# Patient Record
Sex: Male | Born: 1970 | Race: White | Hispanic: No | Marital: Married | Smoking: Former smoker
Health system: Southern US, Community
[De-identification: ages and names within clinical notes are randomized; demographics above are authoritative.]

## PROBLEM LIST (undated history)

## (undated) DIAGNOSIS — S82899A Other fracture of unspecified lower leg, initial encounter for closed fracture: Secondary | ICD-10-CM

## (undated) DIAGNOSIS — K859 Acute pancreatitis without necrosis or infection, unspecified: Secondary | ICD-10-CM

## (undated) HISTORY — DX: Other fracture of unspecified lower leg, initial encounter for closed fracture: S82.899A

## (undated) HISTORY — PX: OTHER SURGICAL HISTORY: SHX169

---

## 2006-07-25 ENCOUNTER — Ambulatory Visit (HOSPITAL_COMMUNITY): Admission: RE | Admit: 2006-07-25 | Discharge: 2006-07-25 | Payer: Self-pay | Admitting: Family Medicine

## 2009-11-04 ENCOUNTER — Emergency Department (HOSPITAL_COMMUNITY): Admission: EM | Admit: 2009-11-04 | Discharge: 2009-11-04 | Payer: Self-pay | Admitting: Emergency Medicine

## 2010-08-02 LAB — CBC
HCT: 45.6 % (ref 39.0–52.0)
Hemoglobin: 15.7 g/dL (ref 13.0–17.0)
MCHC: 34.4 g/dL (ref 30.0–36.0)
MCV: 87.9 fL (ref 78.0–100.0)
Platelets: 127 10*3/uL — ABNORMAL LOW (ref 150–400)
RDW: 13.4 % (ref 11.5–15.5)
WBC: 8 10*3/uL (ref 4.0–10.5)

## 2010-08-02 LAB — DIFFERENTIAL
Basophils Absolute: 0 10*3/uL (ref 0.0–0.1)
Eosinophils Relative: 4 % (ref 0–5)
Monocytes Absolute: 0.5 10*3/uL (ref 0.1–1.0)
Neutro Abs: 5.4 10*3/uL (ref 1.7–7.7)
Neutrophils Relative %: 68 % (ref 43–77)

## 2010-08-02 LAB — BASIC METABOLIC PANEL
BUN: 14 mg/dL (ref 6–23)
CO2: 24 mEq/L (ref 19–32)
Calcium: 9.1 mg/dL (ref 8.4–10.5)
Chloride: 105 mEq/L (ref 96–112)
Creatinine, Ser: 0.84 mg/dL (ref 0.4–1.5)
GFR calc Af Amer: >60 mL/min (ref >60)
GFR calc non Af Amer: >60 mL/min (ref >60)
Potassium: 3.4 mEq/L — ABNORMAL LOW (ref 3.5–5.1)
Sodium: 138 mEq/L (ref 135–145)

## 2010-08-02 LAB — CK TOTAL AND CKMB (NOT AT ARMC): Total CK: 101 U/L (ref 7–232)

## 2010-08-02 LAB — POCT CARDIAC MARKERS
Myoglobin, poc: 41.2 ng/mL (ref 12–200)
Troponin i, poc: <0.05 ng/mL (ref 0.00–0.09)

## 2010-08-02 LAB — TROPONIN I: Troponin I: 0.01 ng/mL (ref 0.00–0.06)

## 2012-02-06 ENCOUNTER — Emergency Department (HOSPITAL_COMMUNITY): Payer: 59

## 2012-02-06 ENCOUNTER — Inpatient Hospital Stay (HOSPITAL_COMMUNITY)
Admission: EM | Admit: 2012-02-06 | Discharge: 2012-02-09 | DRG: 439 | Disposition: A | Discharge status: Home / Self Care | Payer: 59 | Attending: Internal Medicine | Admitting: Internal Medicine

## 2012-02-06 ENCOUNTER — Encounter (HOSPITAL_COMMUNITY): Payer: Self-pay | Admitting: *Deleted

## 2012-02-06 DIAGNOSIS — E860 Dehydration: Secondary | ICD-10-CM | POA: Diagnosis present

## 2012-02-06 DIAGNOSIS — E871 Hypo-osmolality and hyponatremia: Secondary | ICD-10-CM | POA: Diagnosis present

## 2012-02-06 DIAGNOSIS — R7309 Other abnormal glucose: Secondary | ICD-10-CM | POA: Diagnosis present

## 2012-02-06 DIAGNOSIS — K859 Acute pancreatitis without necrosis or infection, unspecified: Principal | ICD-10-CM | POA: Diagnosis present

## 2012-02-06 LAB — COMPREHENSIVE METABOLIC PANEL
ALT: 25 U/L (ref 0–53)
AST: 22 U/L (ref 0–37)
Albumin: 4 g/dL (ref 3.5–5.2)
Alkaline Phosphatase: 70 U/L (ref 39–117)
BUN: 9 mg/dL (ref 6–23)
CO2: 26 mEq/L (ref 19–32)
Calcium: 9.4 mg/dL (ref 8.4–10.5)
Glucose, Bld: 113 mg/dL — ABNORMAL HIGH (ref 70–99)
Sodium: 132 mEq/L — ABNORMAL LOW (ref 135–145)
Total Bilirubin: 0.6 mg/dL (ref 0.3–1.2)
Total Protein: 7.8 g/dL (ref 6.0–8.3)

## 2012-02-06 LAB — URINALYSIS, ROUTINE W REFLEX MICROSCOPIC
Bilirubin Urine: NEGATIVE
Glucose, UA: NEGATIVE mg/dL
Hgb urine dipstick: NEGATIVE
Protein, ur: NEGATIVE mg/dL
Urobilinogen, UA: 0.2 mg/dL (ref 0.0–1.0)
pH: 7 (ref 5.0–8.0)

## 2012-02-06 LAB — CBC WITH DIFFERENTIAL/PLATELET
MCH: 30.1 pg (ref 26.0–34.0)
Platelets: 128 10*3/uL — ABNORMAL LOW (ref 150–400)
RBC: 5.15 MIL/uL (ref 4.22–5.81)

## 2012-02-06 LAB — LIPASE, BLOOD: Lipase: 70 U/L — ABNORMAL HIGH (ref 11–59)

## 2012-02-06 MED ORDER — ONDANSETRON HCL 4 MG/2ML IJ SOLN
4.0000 mg | Freq: Once | INTRAMUSCULAR | Status: AC
  Administered 2012-02-06: 4 mg via INTRAVENOUS
  Filled 2012-02-06: qty 2

## 2012-02-06 MED ORDER — HYDROMORPHONE HCL PF 1 MG/ML IJ SOLN
INTRAMUSCULAR | Status: AC
  Administered 2012-02-06: 1 mg
  Filled 2012-02-06: qty 1

## 2012-02-06 MED ORDER — ONDANSETRON HCL 4 MG/2ML IJ SOLN
INTRAMUSCULAR | Status: AC
  Administered 2012-02-06: 4 mg
  Filled 2012-02-06: qty 2

## 2012-02-06 MED ORDER — IOHEXOL 300 MG/ML  SOLN
100.0000 mL | Freq: Once | INTRAMUSCULAR | Status: AC | PRN
  Administered 2012-02-06: 100 mL via INTRAVENOUS

## 2012-02-06 MED ORDER — HYDROMORPHONE HCL PF 1 MG/ML IJ SOLN
1.0000 mg | Freq: Once | INTRAMUSCULAR | Status: AC
  Administered 2012-02-06: 1 mg via INTRAVENOUS
  Filled 2012-02-06: qty 1

## 2012-02-06 MED ORDER — SODIUM CHLORIDE 0.9 % IV BOLUS (SEPSIS)
1000.0000 mL | Freq: Once | INTRAVENOUS | Status: AC
  Administered 2012-02-06: 1000 mL via INTRAVENOUS

## 2012-02-06 MED ORDER — SODIUM CHLORIDE 0.9 % IV SOLN
INTRAVENOUS | Status: DC
  Administered 2012-02-07 – 2012-02-08 (×4): via INTRAVENOUS
  Filled 2012-02-06 (×11): qty 1000

## 2012-02-06 NOTE — ED Notes (Signed)
Pt c/o abdominal pain (fullness, burning) and fever x 1-2 days.

## 2012-02-06 NOTE — ED Provider Notes (Signed)
History  This chart was scribed for Donnetta Hutching, MD by Erskine Emery. This patient was seen in room APA03/APA03 and the patient's care was started at 19:20.   CSN: [id number]  Arrival date & time 02/06/12  1911   First MD Initiated Contact with Patient 02/06/12 1920      Chief Complaint  Patient presents with  . Abdominal Pain  . Fever    (Consider location/radiation/quality/duration/timing/severity/associated sxs/prior treatment) The history is provided by the patient. No language interpreter was used.  James Underwood is a 41 y.o. male who presents to the Emergency Department complaining of a constant burning periumbilical abdominal pain of an 8/10 severity since Friday (2 days ago). Pt reports associated appetite supression, insomnia, possible fever, and intermittent nausea upon trying to eat but denies any emesis, diarrhea, flank pain, dysuria, frequency, or aggravated pain upon pushing on the area. Pt has been hydrating and taking ibuprofen and percocet (last dose 2 hours ago) with only temporary mild relief from pain. Pt has had heart burn before and claims this pain is nothing like that and much lower in location. Pt has no h/o abdominal surgery. Pt has no other medical conditions and is on no medications.  Dr. Lubertha South is the pt's PCP.  History reviewed. No pertinent past medical history.  History reviewed. No pertinent past surgical history.  History reviewed. No pertinent family history.  History  Substance Use Topics  . Smoking status: Never Smoker   . Smokeless tobacco: Not on file  . Alcohol Use: No      Review of Systems A complete 10 system review of systems was obtained and all systems are negative except as noted in the HPI and PMH.    Allergies  Review of patient's allergies indicates no known allergies.  Home Medications  No current outpatient prescriptions on file.  Triage Vitals: BP 152/97  Pulse 90  Temp 98.6 F (37 C)  Resp 18  Ht 5\' 11"   (1.803 m)  Wt 200 lb (90.719 kg)  BMI 27.89 kg/m2  SpO2 100%  Physical Exam  Nursing note and vitals reviewed. Constitutional: He is oriented to person, place, and time. He appears well-developed and well-nourished.  HENT:  Head: Normocephalic and atraumatic.  Eyes: Conjunctivae normal and EOM are normal. Pupils are equal, round, and reactive to light.  Neck: Normal range of motion. Neck supple.  Cardiovascular: Normal rate, regular rhythm and normal heart sounds.   Pulmonary/Chest: Effort normal and breath sounds normal.  Abdominal: Soft. Bowel sounds are normal. There is no tenderness.       Pt is nontender upon palpation.  Musculoskeletal: Normal range of motion.  Neurological: He is alert and oriented to person, place, and time.  Skin: Skin is warm and dry.  Psychiatric: He has a normal mood and affect.    ED Course  Procedures (including critical care time) DIAGNOSTIC STUDIES: Oxygen Saturation is 100% on room air, normal by my interpretation.    COORDINATION OF CARE: 19:50--I evaluated the patient and we discussed a treatment plan including abdominal CT, pain medication (Dilaudid), blood work, urinalysis, and IV fluids to which the pt agreed.    Results for orders placed during the hospital encounter of 02/06/12  CBC WITH DIFFERENTIAL      Component Value Range   WBC 13.9 (*) 4.0 - 10.5 K/uL   RBC 5.15  4.22 - 5.81 MIL/uL   Hemoglobin 15.5  13.0 - 17.0 g/dL   HCT 11.9  14.7 - 82.9 %  MCV 83.3  78.0 - 100.0 fL   MCH 30.1  26.0 - 34.0 pg   MCHC 36.1 (*) 30.0 - 36.0 g/dL   RDW 16.1  09.6 - 04.5 %   Platelets 128 (*) 150 - 400 K/uL   Neutrophils Relative 86 (*) 43 - 77 %   Neutro Abs 12.0 (*) 1.7 - 7.7 K/uL   Lymphocytes Relative 5 (*) 12 - 46 %   Lymphs Abs 0.7  0.7 - 4.0 K/uL   Monocytes Relative 8  3 - 12 %   Monocytes Absolute 1.1 (*) 0.1 - 1.0 K/uL   Eosinophils Relative 1  0 - 5 %   Eosinophils Absolute 0.1  0.0 - 0.7 K/uL   Basophils Relative 0  0 - 1 %    Basophils Absolute 0.0  0.0 - 0.1 K/uL  COMPREHENSIVE METABOLIC PANEL      Component Value Range   Sodium 132 (*) 135 - 145 mEq/L   Potassium 3.5  3.5 - 5.1 mEq/L   Chloride 96  96 - 112 mEq/L   CO2 26  19 - 32 mEq/L   Glucose, Bld 113 (*) 70 - 99 mg/dL   BUN 9  6 - 23 mg/dL   Creatinine, Ser 4.09  0.50 - 1.35 mg/dL   Calcium 9.4  8.4 - 81.1 mg/dL   Total Protein 7.8  6.0 - 8.3 g/dL   Albumin 4.0  3.5 - 5.2 g/dL   AST 22  0 - 37 U/L   ALT 25  0 - 53 U/L   Alkaline Phosphatase 70  39 - 117 U/L   Total Bilirubin 0.6  0.3 - 1.2 mg/dL   GFR calc non Af Amer >90  >90 mL/min   GFR calc Af Amer >90  >90 mL/min  LIPASE, BLOOD      Component Value Range   Lipase 70 (*) 11 - 59 U/L  URINALYSIS, ROUTINE W REFLEX MICROSCOPIC      Component Value Range   Color, Urine YELLOW  YELLOW   APPearance CLEAR  CLEAR   Specific Gravity, Urine 1.015  1.005 - 1.030   pH 7.0  5.0 - 8.0   Glucose, UA NEGATIVE  NEGATIVE mg/dL   Hgb urine dipstick NEGATIVE  NEGATIVE   Bilirubin Urine NEGATIVE  NEGATIVE   Ketones, ur NEGATIVE  NEGATIVE mg/dL   Protein, ur NEGATIVE  NEGATIVE mg/dL   Urobilinogen, UA 0.2  0.0 - 1.0 mg/dL   Nitrite NEGATIVE  NEGATIVE   Leukocytes, UA NEGATIVE  NEGATIVE    Ct Abdomen Pelvis W Contrast  02/06/2012  *RADIOLOGY REPORT*  Clinical Data: 41 year old male abdominal pain and anorexia nausea.  CT ABDOMEN AND PELVIS WITH CONTRAST  Technique:  Multidetector CT imaging of the abdomen and pelvis was performed following the standard protocol during bolus administration of intravenous contrast.  Contrast: OMNIPAQUE IOHEXOL 300 MG/ML  SOLN  Comparison: None.  Findings: No pericardial or pleural effusion.  Minor atelectasis at the lung bases. No acute osseous abnormality identified.  No pelvic free fluid.  Negative bladder and distal colon.  Normal proximal colon and appendix.  Oral contrast has not yet reached the terminal ileum.  No dilated or inflamed small bowel loops identified.  The  stomach is distended with contrast in a air, otherwise negative.  The duodenum is within normal limits. Negative gallbladder, liver, spleen, adrenal glands, portal venous system, and kidneys.  The head and uncinate process of the pancreas are mildly inflamed. With stranding  mostly involving the right root of the small bowel mesentery on series 2 image 36.  No significant changes in the duodenum at this time.  The body and tail of the pancreas appear more normal.  No pancreatic or biliary ductal dilatation.  Major arterial structures are patent.  No abdominal free fluid.  No lymphadenopathy.  IMPRESSION: 1.  Acute pancreatitis.  Inflammatory changes limited to the pancreatic head and uncinate at this time and no complicating features. 2.  Normal appendix. 3.  Mild atelectasis.   Original Report Authenticated By: Harley Hallmark, M.D.      No diagnosis found.    MDM  CT scan suggests acute pancreatitis.   Admit to general medicine.      I personally performed the services described in this documentation, which was scribed in my presence. The recorded information has been reviewed and considered.    Donnetta Hutching, MD 02/06/12 2328

## 2012-02-06 NOTE — H&P (Signed)
[facility] History and Physical  James Underwood  GEX:[id number]  DOB: 1970-09-16   DOA: 02/06/2012   PCP:   Harlow Asa, MD   Chief Complaint:  Abdominal pain since Friday night  HPI: James Underwood is an 41 y.o. male.  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 She's had episodic vomiting for the past couple of days and eventually presented to the emergency room where a CT scan of the abdomen was done which showed evidence of acute pancreatitis,  He drinks about 5 beers per week  Rewiew of Systems:   All systems negative except as marked bold or noted in the HPI;  Constitutional: Negative for , fever and chills. ;  Eyes: Negative for eye pain, redness and discharge. ;  ENMT: Negative for ear pain, hoarseness, nasal congestion, sinus pressure and sore throat. ;  Cardiovascular: Negative for chest pain, palpitations, diaphoresis, dyspnea and peripheral edema. ;  Respiratory: Negative for cough, hemoptysis, wheezing and stridor. ;  Gastrointestinal: Negative for , diarrhea, constipation, , melena, blood in stool, hematemesis, jaundice and rectal bleeding. unusual weight loss..   Genitourinary: Negative for frequency, dysuria, incontinence,flank pain and hematuria; Musculoskeletal: Negative for back pain and neck pain. Negative for swelling and trauma.;  Skin: . Negative for pruritus, rash, abrasions, bruising and skin lesion.; ulcerations Neuro: Negative for headache, lightheadedness and neck stiffness. Negative for weakness, altered level of consciousness , altered mental status, extremity weakness, burning feet, involuntary movement, seizure and syncope.    Psych: negative for anxiety, depression, insomnia, tearfulness, panic attacks, hallucinations, paranoia, suicidal or homicidal ideation    History reviewed. No pertinent past medical history.  History reviewed. No pertinent past surgical history.  Medications:  HOME MEDS: Prior to Admission medications   Not on File   Episodic ibuprofen  Allergies:  No Known Allergies  Social History:   reports that he has never smoked. He does not have any smokeless tobacco history on file. He reports that he drinks about 2.5 ounces of alcohol per week. He reports that he does not use illicit drugs.  Family History: History reviewed. No pertinent family history. See above  Physical Exam: Filed Vitals:   02/06/12 1914 02/06/12 2342  BP: 152/97 135/78  Pulse: 90 79  Temp: 98.6 F (37 C) 99.4 F (37.4 C)  TempSrc:  Oral  Resp: 18   Height: 5\' 11"  (1.803 m)   Weight: 90.719 kg (200 lb)   SpO2: 100% 95%   Blood pressure 135/78, pulse 79, temperature 99.4 F (37.4 C), temperature source Oral, resp. rate 18, height 5\' 11"  (1.803 m), weight 90.719 kg (200 lb), SpO2 95.00%.  GEN:  Pleasant middle-aged Caucasian gentleman lying in the stretcher in no acute distress; looks dehydrated; cooperative with exam PSYCH:  alert and oriented x4; does not appear anxious or depressed; affect is appropriate. HEENT: Mucous membranes pink , dry and anicteric; PERRLA; EOM intact; no cervical lymphadenopathy nor thyromegaly or carotid bruit; no JVD; Breasts:: Not examined CHEST WALL: No tenderness CHEST: Normal respiration, clear to auscultation bilaterally HEART: Regular rate and rhythm; no murmurs rubs or gallops BACK: No kyphosis or scoliosis; no CVA tenderness ABDOMEN: , soft mild central-tendermness; no masses, no organomegaly, decreased l abdominal bowel  sounds; no pannus; no intertriginous candida. Rectal Exam: Not done EXTREMITIES: No bone or joint deformity; no edema; no ulcerations. Genitalia:  not examined PULSES: 2+ and symmetric SKIN: Normal hydration no rash or ulceration CNS: Cranial nerves 2-12 grossly intact no focal lateralizing neurologic deficit   Labs on Admission:  Basic Metabolic Panel:  Lab 02/06/12 7829  NA 132*  K 3.5  CL 96  CO2 26  GLUCOSE 113*  BUN 9  CREATININE 0.87  CALCIUM 9.4  MG --  PHOS --   Liver Function Tests:  Lab 02/06/12 2014  AST 22  ALT 25  ALKPHOS 70  BILITOT 0.6  PROT 7.8  ALBUMIN 4.0    Lab 02/06/12 2014  LIPASE 70*  AMYLASE --   No results found for this basename: AMMONIA:5 in the last 168 hours CBC:  Lab 02/06/12 2014  WBC 13.9*  NEUTROABS 12.0*  HGB 15.5  HCT 42.9  MCV 83.3  PLT 128*   Cardiac Enzymes: No results found for this basename: CKTOTAL:5,CKMB:5,CKMBINDEX:5,TROPONINI:5 in the last 168 hours BNP: No components found with this basename: POCBNP:5 D-dimer: No components found with this basename: D-DIMER:5 CBG: No results found for this basename: GLUCAP:5 in the last 168 hours  Radiological Exams on Admission: Ct Abdomen Pelvis W Contrast  02/06/2012  *RADIOLOGY REPORT*  Clinical Data: 41 year old male abdominal pain and anorexia nausea.  CT ABDOMEN AND PELVIS WITH CONTRAST  Technique:  Multidetector CT imaging of the abdomen and pelvis was performed following the standard protocol during bolus administration of intravenous contrast.  Contrast: OMNIPAQUE IOHEXOL 300 MG/ML  SOLN  Comparison: None.  Findings: No pericardial or pleural effusion.  Minor atelectasis at the lung bases. No acute osseous abnormality identified.  No pelvic free fluid.  Negative bladder and distal colon.  Normal proximal colon and appendix.  Oral contrast has not yet reached the terminal ileum.  No dilated or inflamed small bowel loops identified.  The stomach is distended with contrast in a air, otherwise negative.  The duodenum is within normal limits. Negative gallbladder, liver, spleen, adrenal glands, portal venous system,  and kidneys.  The head and uncinate process of the pancreas are mildly inflamed. With stranding mostly involving the right root of the small bowel mesentery on series 2 image 36.  No significant changes in the duodenum at this time.  The body and tail of the pancreas appear more normal.  No pancreatic or biliary ductal dilatation.  Major arterial structures are patent.  No abdominal free fluid.  No lymphadenopathy.  IMPRESSION: 1.  Acute pancreatitis.  Inflammatory changes limited to the pancreatic head and uncinate at this time and no complicating features. 2.  Normal appendix. 3.  Mild atelectasis.   Original Report Authenticated By: Harley Hallmark, M.D.       Assessment/Plan Present on Admission:  .Acute pancreatitis .Hyponatremia due to dehydration  .Dehydration   PLAN: Continue hydration; admitted for continued hydration and pain control; trial of clear liquid diet in the morning  Other plans as per orders.  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

## 2012-02-07 ENCOUNTER — Inpatient Hospital Stay (HOSPITAL_COMMUNITY): Payer: 59

## 2012-02-07 LAB — HEMOGLOBIN A1C: Mean Plasma Glucose: 120 mg/dL — ABNORMAL HIGH (ref <117)

## 2012-02-07 LAB — COMPREHENSIVE METABOLIC PANEL
ALT: 21 U/L (ref 0–53)
AST: 20 U/L (ref 0–37)
Alkaline Phosphatase: 81 U/L (ref 39–117)
CO2: 26 mEq/L (ref 19–32)
GFR calc Af Amer: >90 mL/min (ref >90)
Glucose, Bld: 111 mg/dL — ABNORMAL HIGH (ref 70–99)
Potassium: 4.2 mEq/L (ref 3.5–5.1)
Sodium: 136 mEq/L (ref 135–145)
Total Protein: 7.2 g/dL (ref 6.0–8.3)

## 2012-02-07 LAB — CBC
Hemoglobin: 14.7 g/dL (ref 13.0–17.0)
MCHC: 35.7 g/dL (ref 30.0–36.0)
Platelets: 131 10*3/uL — ABNORMAL LOW (ref 150–400)
RBC: 4.89 MIL/uL (ref 4.22–5.81)

## 2012-02-07 LAB — LIPID PANEL
Cholesterol: 189 mg/dL (ref 0–200)
HDL: 61 mg/dL (ref >39)
Total CHOL/HDL Ratio: 3.1 RATIO
VLDL: 11 mg/dL (ref 0–40)

## 2012-02-07 LAB — TSH: TSH: 0.579 u[IU]/mL (ref 0.350–4.500)

## 2012-02-07 LAB — MAGNESIUM: Magnesium: 2.1 mg/dL (ref 1.5–2.5)

## 2012-02-07 MED ORDER — FLEET ENEMA 7-19 GM/118ML RE ENEM: 1.0000 | ENEMA | Freq: Once | RECTAL | Status: AC | PRN

## 2012-02-07 MED ORDER — ENOXAPARIN SODIUM 40 MG/0.4ML ~~LOC~~ SOLN
40.0000 mg | SUBCUTANEOUS | Status: DC
  Administered 2012-02-07 – 2012-02-08 (×2): 40 mg via SUBCUTANEOUS
  Filled 2012-02-07 (×3): qty 0.4

## 2012-02-07 MED ORDER — POTASSIUM CHLORIDE IN NACL 20-0.9 MEQ/L-% IV SOLN
INTRAVENOUS | Status: AC
  Administered 2012-02-07: 1000 mL
  Filled 2012-02-07: qty 1000

## 2012-02-07 MED ORDER — ENOXAPARIN SODIUM 40 MG/0.4ML ~~LOC~~ SOLN
40.0000 mg | SUBCUTANEOUS | Status: DC
  Administered 2012-02-07: 40 mg via SUBCUTANEOUS

## 2012-02-07 MED ORDER — HYDROMORPHONE HCL PF 1 MG/ML IJ SOLN
0.5000 mg | INTRAMUSCULAR | Status: DC | PRN
  Administered 2012-02-07 – 2012-02-08 (×9): 1 mg via INTRAVENOUS
  Filled 2012-02-07 (×9): qty 1

## 2012-02-07 MED ORDER — ONDANSETRON HCL 4 MG/2ML IJ SOLN
4.0000 mg | INTRAMUSCULAR | Status: DC | PRN
  Administered 2012-02-07: 4 mg via INTRAVENOUS
  Filled 2012-02-07: qty 2

## 2012-02-07 MED ORDER — INFLUENZA VIRUS VACC SPLIT PF IM SUSP
0.5000 mL | INTRAMUSCULAR | Status: AC
  Administered 2012-02-07: 0.5 mL via INTRAMUSCULAR
  Filled 2012-02-07: qty 0.5

## 2012-02-07 MED ORDER — TRAZODONE HCL 50 MG PO TABS
25.0000 mg | ORAL_TABLET | Freq: Every evening | ORAL | Status: DC | PRN
  Filled 2012-02-07: qty 1

## 2012-02-07 MED ORDER — BISACODYL 10 MG RE SUPP: 10.0000 mg | Freq: Every day | RECTAL | Status: DC | PRN

## 2012-02-07 NOTE — Progress Notes (Signed)
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 There is no history of alcohol abuse, no history that he knows of hypertriglyceridemia. He still has her gallbladder. This morning, the burning epigastric pain that he presented with is still present but he does not have any nausea or vomiting. He actually feels hungry now.           Physical Exam: Blood pressure 131/80, pulse 85, temperature 98.6 F (37 C), temperature source Oral, resp. rate 18, height 5\' 11"  (1.803 m), weight 94.711 kg (208 lb 12.8 oz), SpO2 94.00%. He looks systemically well. He does not appear to be any obvious acute pain. There is no jaundice. Abdomen is soft and not vertically tender. Bowel sounds are heard although somewhat scanty. There is no hepatomegaly. There are no masses. There is no splenomegaly. Heart sounds are present and normal. There are no murmurs. Lung fields are clear. He is alert and orientated.   Investigations:     Basic Metabolic Panel:  Basename 02/07/12 0519 02/06/12 2014  NA 136 132*  K 4.2 3.5  CL 102 96  CO2 26 26  GLUCOSE 111* 113*  BUN 6 9  CREATININE 0.87 0.87  CALCIUM 8.9 9.4  MG 2.1 --  PHOS -- --   Liver Function Tests:  [facility] 02/07/12 0519 02/06/12 2014  AST 20 22  ALT 21 25  ALKPHOS 81 70  BILITOT 0.6 0.6  PROT 7.2 7.8  ALBUMIN 3.5 4.0     CBC:  Basename 02/07/12 0519 02/06/12 2014  WBC 12.0* 13.9*  NEUTROABS -- 12.0*  HGB 14.7 15.5  HCT 41.2 42.9  MCV 84.3 83.3  PLT 131* 128*    Ct Abdomen Pelvis W Contrast  02/06/2012  *RADIOLOGY REPORT*  Clinical Data: 41 year old male abdominal pain and anorexia nausea.  CT ABDOMEN AND PELVIS WITH CONTRAST  Technique:  Multidetector CT imaging of the abdomen and pelvis was performed following the standard protocol during bolus administration of intravenous contrast.  Contrast: OMNIPAQUE IOHEXOL 300 MG/ML  SOLN  Comparison: None.  Findings: No pericardial or pleural  effusion.  Minor atelectasis at the lung bases. No acute osseous abnormality identified.  No pelvic free fluid.  Negative bladder and distal colon.  Normal proximal colon and appendix.  Oral contrast has not yet reached the terminal ileum.  No dilated or inflamed small bowel loops identified.  The stomach is distended with contrast in a air, otherwise negative.  The duodenum is within normal limits. Negative gallbladder, liver, spleen, adrenal glands, portal venous system, and kidneys.  The head and uncinate process of the pancreas are mildly inflamed. With stranding mostly involving the right root of the small bowel mesentery on series 2 image 36.  No significant changes in the duodenum at this time.  The body and tail of the pancreas appear more normal.  No pancreatic or biliary ductal dilatation.  Major arterial structures are patent.  No abdominal free fluid.  No lymphadenopathy.  IMPRESSION: 1.  Acute pancreatitis.  Inflammatory changes limited to the pancreatic head and uncinate at this time and no complicating features. 2.  Normal appendix. 3.  Mild atelectasis.   Original Report Authenticated By: Harley Hallmark, M.D.       Medications: I have reviewed the patient's current medications.  Impression: 1. Acute pancreatitis of unclear etiology. 2. Hyperglycemia. No history of diabetes.     Plan: 1. We will check lipid panel and hemoglobin A1c now as he is already  fasting. 2. After this blood work is done, he can advance his diet with clear fluids and proceed as tolerated. 3. Ultrasound of the abdomen to look for presence of gallstone pathology. This is unlikely in view of normal alkaline phosphatase and other liver enzymes.     LOS: 1 day   Wilson Singer Pager [phone]  02/07/2012, 11:04 AM

## 2012-02-07 NOTE — Care Management Note (Signed)
    Page 1 of 1   02/09/2012     2:54:24 PM   CARE MANAGEMENT NOTE 02/09/2012  Patient:  James Underwood, James Underwood   Account Number:  [id number]  Date Initiated:  02/07/2012  Documentation initiated by:  Rosemary Holms  Subjective/Objective Assessment:   Pt lives at home with his wife and family. Admitted with abdominal pain. Spoke to pt and his wife, who is a Char[employer] fundraiser.     Action/Plan:   No HH needs identified.   Anticipated DC Date:  02/09/2012   Anticipated DC Plan:  HOME/SELF CARE      DC Planning Services  CM consult      Choice offered to / List presented to:             Status of service:  Completed, signed off Medicare Important Message given?   (If response is "NO", the following Medicare IM given date fields will be blank) Date Medicare IM given:   Date Additional Medicare IM given:    Discharge Disposition:  HOME/SELF CARE  Per UR Regulation:    If discussed at Long Length of Stay Meetings, dates discussed:    Comments:  02/07/12 Rosemary Holms RN BSN CM

## 2012-02-07 NOTE — Progress Notes (Signed)
UR Chart Review Completed  

## 2012-02-08 ENCOUNTER — Encounter (HOSPITAL_COMMUNITY): Payer: Self-pay | Admitting: Gastroenterology

## 2012-02-08 DIAGNOSIS — K859 Acute pancreatitis without necrosis or infection, unspecified: Secondary | ICD-10-CM

## 2012-02-08 LAB — COMPREHENSIVE METABOLIC PANEL
Albumin: 3.3 g/dL — ABNORMAL LOW (ref 3.5–5.2)
BUN: 8 mg/dL (ref 6–23)
Calcium: 9.1 mg/dL (ref 8.4–10.5)
GFR calc Af Amer: >90 mL/min (ref >90)
Glucose, Bld: 99 mg/dL (ref 70–99)
Potassium: 4.3 mEq/L (ref 3.5–5.1)
Total Protein: 7.2 g/dL (ref 6.0–8.3)

## 2012-02-08 LAB — CBC
HCT: 41.7 % (ref 39.0–52.0)
Hemoglobin: 14.7 g/dL (ref 13.0–17.0)
MCH: 30.1 pg (ref 26.0–34.0)
MCHC: 35.3 g/dL (ref 30.0–36.0)
RDW: 12.5 % (ref 11.5–15.5)

## 2012-02-08 MED ORDER — POTASSIUM CHLORIDE IN NACL 20-0.9 MEQ/L-% IV SOLN
INTRAVENOUS | Status: DC
  Administered 2012-02-08 – 2012-02-09 (×2): via INTRAVENOUS

## 2012-02-08 MED ORDER — HYDROMORPHONE HCL PF 1 MG/ML IJ SOLN
0.5000 mg | INTRAMUSCULAR | Status: DC | PRN
  Administered 2012-02-08 – 2012-02-09 (×4): 1 mg via INTRAVENOUS
  Filled 2012-02-08 (×4): qty 1

## 2012-02-08 MED ORDER — PANTOPRAZOLE SODIUM 40 MG PO TBEC
40.0000 mg | DELAYED_RELEASE_TABLET | Freq: Every day | ORAL | Status: DC
  Administered 2012-02-08 – 2012-02-09 (×2): 40 mg via ORAL
  Filled 2012-02-08 (×2): qty 1

## 2012-02-08 MED ORDER — SODIUM CHLORIDE 0.9 % IJ SOLN
INTRAMUSCULAR | Status: AC
  Filled 2012-02-08: qty 3

## 2012-02-08 NOTE — Consult Note (Signed)
Referring Provider: No ref. provider found Primary Care Physician:  Harlow Asa, MD Primary Gastroenterologist:  Dr. Darrick Penna   Date of Admission: 02/06/12 Date of Consultation: 02/08/12  Reason for Consultation:  Pancreatitis  HPI:  41 year old male presenting to ED on 9/22 with abdominal pain, vomiting, CT with findings of acute pancreatitis. Admitting lipase only mildly elevated at 70. 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 Drinks beer, wine at dinner. Glass of wine or beer or 2. Will go several days without having something to drink then have a small amount. Takes Ibuprofen prn. First episode of pancreatitis. Noted Saturday night onset of symptoms. Acute onset of pain. Paced all night, couldn't sleep, couldn't get comfortable. Had burning in epigastric area. Worsened, prompting ED evaluation. Only nausea with trying to take pain medication. Lost appetite. Today tolerated 1/2 [place] sandwhich. Eating lots of jello, water, sprite, popsicles. Pain improved since admission.   Lipid panel drawn while inpatient, no evidence of hypertriglyceridemia. [place] of abd performed, negative for gallstones. LFTs normal on admission and have remained WNL.    PMH: None  Past Surgical History  Procedure Date  . None     Prior to Admission medications   Not on File    Current Facility-Administered Medications  Medication Dose Route Frequency Provider Last Rate Last Dose  . 0.9 % NaCl with KCl 20 mEq/ L  infusion   Intravenous Continuous Wilson Singer, MD 75 mL/hr at 02/08/12 1145    . bisacodyl (DULCOLAX) suppository 10 mg  10 mg Rectal Daily PRN Vania Rea, MD      . enoxaparin (LOVENOX) injection 40 mg  40 mg Subcutaneous Q24H Vania Rea, MD   40 mg at 02/08/12 0906  . HYDROmorphone (DILAUDID) injection 0.5-1 mg  0.5-1 mg Intravenous Q4H PRN Wilson Singer, MD   1 mg at 02/08/12 1132  . ondansetron (ZOFRAN) injection 4 mg  4 mg Intravenous Q4H PRN Vania Rea, MD   4  mg at 02/07/12 0344  . sodium phosphate (FLEET) 7-19 GM/118ML enema 1 enema  1 enema Rectal Once PRN Vania Rea, MD      . traZODone (DESYREL) tablet 25 mg  25 mg Oral QHS PRN Vania Rea, MD      . DISCONTD: HYDROmorphone (DILAUDID) injection 0.5-1 mg  0.5-1 mg Intravenous Q2H PRN Vania Rea, MD   1 mg at 02/08/12 0650  . DISCONTD: sodium chloride 0.9 % 1,000 mL with potassium chloride 20 mEq infusion   Intravenous Continuous Wilson Singer, MD 75 mL/hr at 02/08/12 1139      Allergies as of 02/06/2012  . (No Known Allergies)    Family History  Problem Relation Age of Onset  . Colon cancer Neg Hx     History   Social History  . Marital Status: Married    Spouse Name: N/A    Number of Children: N/A  . Years of Education: N/A   Occupational History  . Police Officer [employer]   Social History Main Topics  . Smoking status: Never Smoker   . Smokeless tobacco: Not on file  . Alcohol Use: 2.5 oz/week    5 drink(s) per week  . Drug Use: No  . Sexually Active: Not on file   Other Topics Concern  . Not on file   Social History Narrative  . No narrative on file    Review of Systems: Gen: notes slight fever prior to admission, no chills CV: Denies chest pain, heart palpitations, syncope,  edema  Resp: Denies shortness of breath with rest, cough, wheezing GI: Denies dysphagia or odynophagia. Denies vomiting blood, jaundice, and fecal incontinence.  GU : Denies urinary burning, urinary frequency, urinary incontinence.  MS: Denies joint pain,swelling, cramping Derm: Denies rash, itching, dry skin Psych: Denies depression, anxiety,confusion, or memory loss Heme: Denies bruising, bleeding, and enlarged lymph nodes.  Physical Exam: Vital signs in last 24 hours: Temp:  [98.9 F (37.2 C)-99.3 F (37.4 C)] 98.9 F (37.2 C) (09/24 1355) Pulse Rate:  [89-94] 89  (09/24 1355) Resp:  [18-19] 18  (09/24 1355) BP: (139-152)/(87-94) 152/94 mmHg (09/24  1355) SpO2:  [95 %-98 %] 96 % (09/24 1355) Weight:  [209 lb (94.802 kg)] 209 lb (94.802 kg) (09/24 0626) Last BM Date: 02/06/12 General:   Alert,  Well-developed, well-nourished, pleasant and cooperative in NAD Head:  Normocephalic and atraumatic. Eyes:  Sclera clear, no icterus.   Conjunctiva pink. Ears:  Normal auditory acuity. Nose:  No deformity, discharge,  or lesions. Mouth:  No deformity or lesions, dentition normal. Neck:  Supple; no masses or thyromegaly. Lungs:  Clear throughout to auscultation.   No wheezes, crackles, or rhonchi. No acute distress. Heart:  S1 S2 present; no murmurs, clicks, rubs,  or gallops. Abdomen:  Soft, nontender and nondistended. No masses, hepatosplenomegaly or hernias noted. Normal bowel sounds, without guarding, and without rebound.   Rectal:  Deferred  Msk:  Symmetrical without gross deformities. Normal posture. Pulses:  Normal pulses noted. Extremities:  Without clubbing or edema. Neurologic:  Alert and  oriented x4;  grossly normal neurologically. Skin:  Intact without significant lesions or rashes. Cervical Nodes:  No significant cervical adenopathy. Psych:  Alert and cooperative. Normal mood and affect.  Intake/Output from previous day: 09/23 0701 - 09/24 0700 In: 1755 [P.O.:480; I.V.:1275] Out: 2375 [Urine:2375] Intake/Output this shift: Total I/O In: 600 [P.O.:600] Out: 1050 [Urine:1050]  Lab Results:  [facility] 02/08/12 0519 02/07/12 0519 02/06/12 2014  WBC 11.8* 12.0* 13.9*  HGB 14.7 14.7 15.5  HCT 41.7 41.2 42.9  PLT 136* 131* 128*   BMET  Basename 02/08/12 0519 02/07/12 0519 02/06/12 2014  NA 133* 136 132*  K 4.3 4.2 3.5  CL 100 102 96  CO2 26 26 26   GLUCOSE 99 111* 113*  BUN 8 6 9   CREATININE 0.92 0.87 0.87  CALCIUM 9.1 8.9 9.4   LFT  Basename 02/08/12 0519 02/07/12 0519 02/06/12 2014  PROT 7.2 7.2 7.8  ALBUMIN 3.3* 3.5 4.0  AST 16 20 22   ALT 17 21 25   ALKPHOS 70 81 70  BILITOT 0.8 0.6 0.6  BILIDIR -- -- --    IBILI -- -- --    Studies/Results: Ct Abdomen Pelvis W Contrast  02/06/2012  *RADIOLOGY REPORT*  Clinical Data: 41 year old male abdominal pain and anorexia nausea.  CT ABDOMEN AND PELVIS WITH CONTRAST  Technique:  Multidetector CT imaging of the abdomen and pelvis was performed following the standard protocol during bolus administration of intravenous contrast.  Contrast: OMNIPAQUE IOHEXOL 300 MG/ML  SOLN  Comparison: None.  Findings: No pericardial or pleural effusion.  Minor atelectasis at the lung bases. No acute osseous abnormality identified.  No pelvic free fluid.  Negative bladder and distal colon.  Normal proximal colon and appendix.  Oral contrast has not yet reached the terminal ileum.  No dilated or inflamed small bowel loops identified.  The stomach is distended with contrast in a air, otherwise negative.  The duodenum is within normal limits. Negative gallbladder, liver,  spleen, adrenal glands, portal venous system, and kidneys.  The head and uncinate process of the pancreas are mildly inflamed. With stranding mostly involving the right root of the small bowel mesentery on series 2 image 36.  No significant changes in the duodenum at this time.  The body and tail of the pancreas appear more normal.  No pancreatic or biliary ductal dilatation.  Major arterial structures are patent.  No abdominal free fluid.  No lymphadenopathy.  IMPRESSION: 1.  Acute pancreatitis.  Inflammatory changes limited to the pancreatic head and uncinate at this time and no complicating features. 2.  Normal appendix. 3.  Mild atelectasis.   Original Report Authenticated By: Harley Hallmark, M.D.    US Abdomen Limited Ruq  02/07/2012  *RADIOLOGY REPORT*  Clinical Data:  Pancreatitis, evaluate for gallstones  LIMITED ABDOMINAL ULTRASOUND - RIGHT UPPER QUADRANT  Comparison:  CT abdomen pelvis dated 02/06/2012  Findings:  Gallbladder:  No gallstones, gallbladder wall thickening, or pericholecystic fluid.  Common bile  duct:  Measures 3 mm.  Liver:  Within normal limits for parenchymal echogenicity.  No focal hepatic lesion is seen.  IMPRESSION: Negative right upper quadrant ultrasound.                    Original Report Authenticated By: Charline Bills, M.D.     Impression: Mr. Knippenberg is a very pleasant 41 year old male, police officer by profession, who presented with first episode of acute pancreatitis confirmed by CT. No evidence of gallstones on [place], LFTs within normal limits. Lipase only 70 on admission. ETOH use intermittently but not excessive. No prior medications other than Ibuprofen noted before admission. No evidence for gallstone pancreatitis, and the specific etiology of episode is unknown at this time. Counseled to avoid ETOH and would recommend EUS in 4-6 weeks to evaluate for any occult, underlying issues. Pt continues to improve and is tolerating a low-fat diet currently. Will place on GI prophylaxis and anticipate d/c home soon.   Plan: PPI daily for GI prophylaxis, d/c to home with PPI  Outpatient f/u with [place] in 4-6 weeks EUS as outpatient Supportive measures Avoid ETOH   LOS: 2 days   Gerrit Halls  02/08/2012, 3:12 PM

## 2012-02-08 NOTE — Progress Notes (Signed)
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 There is no history of alcohol abuse, there is no evidence of gallbladder pathology on ultrasound scanning. He feels somewhat better today with less pain although still requiring intravenous opioids. He is tolerated a clear liquid diet. The wife is keen that the patient sees a gastroenterologist.           Physical Exam: Blood pressure 140/90, pulse 90, temperature 98.9 F (37.2 C), temperature source Oral, resp. rate 19, height 5\' 11"  (1.803 m), weight 94.802 kg (209 lb), SpO2 95.00%. He looks systemically well. He does not appear to be any obvious acute pain. There is no jaundice. Abdomen is soft and not  tender. Bowel sounds are heard although somewhat scanty. There is no hepatomegaly. There are no masses. There is no splenomegaly. Heart sounds are present and normal. There are no murmurs. Lung fields are clear. He is alert and orientated.   Investigations:     Basic Metabolic Panel:  Basename 02/08/12 0519 02/07/12 0519  NA 133* 136  K 4.3 4.2  CL 100 102  CO2 26 26  GLUCOSE 99 111*  BUN 8 6  CREATININE 0.92 0.87  CALCIUM 9.1 8.9  MG -- 2.1  PHOS -- --   Liver Function Tests:  [facility] 02/08/12 0519 02/07/12 0519  AST 16 20  ALT 17 21  ALKPHOS 70 81  BILITOT 0.8 0.6  PROT 7.2 7.2  ALBUMIN 3.3* 3.5     CBC:  Basename 02/08/12 0519 02/07/12 0519 02/06/12 2014  WBC 11.8* 12.0* --  NEUTROABS -- -- 12.0*  HGB 14.7 14.7 --  HCT 41.7 41.2 --  MCV 85.3 84.3 --  PLT 136* 131* --    Ct Abdomen Pelvis W Contrast  02/06/2012  *RADIOLOGY REPORT*  Clinical Data: 41 year old male abdominal pain and anorexia nausea.  CT ABDOMEN AND PELVIS WITH CONTRAST  Technique:  Multidetector CT imaging of the abdomen and pelvis was performed following the standard protocol during bolus administration of intravenous contrast.  Contrast: OMNIPAQUE IOHEXOL 300 MG/ML  SOLN  Comparison: None.  Findings:  No pericardial or pleural effusion.  Minor atelectasis at the lung bases. No acute osseous abnormality identified.  No pelvic free fluid.  Negative bladder and distal colon.  Normal proximal colon and appendix.  Oral contrast has not yet reached the terminal ileum.  No dilated or inflamed small bowel loops identified.  The stomach is distended with contrast in a air, otherwise negative.  The duodenum is within normal limits. Negative gallbladder, liver, spleen, adrenal glands, portal venous system, and kidneys.  The head and uncinate process of the pancreas are mildly inflamed. With stranding mostly involving the right root of the small bowel mesentery on series 2 image 36.  No significant changes in the duodenum at this time.  The body and tail of the pancreas appear more normal.  No pancreatic or biliary ductal dilatation.  Major arterial structures are patent.  No abdominal free fluid.  No lymphadenopathy.  IMPRESSION: 1.  Acute pancreatitis.  Inflammatory changes limited to the pancreatic head and uncinate at this time and no complicating features. 2.  Normal appendix. 3.  Mild atelectasis.   Original Report Authenticated By: Harley Hallmark, M.D.    US Abdomen Limited Ruq  02/07/2012  *RADIOLOGY REPORT*  Clinical Data:  Pancreatitis, evaluate for gallstones  LIMITED ABDOMINAL ULTRASOUND - RIGHT UPPER QUADRANT  Comparison:  CT abdomen pelvis dated 02/06/2012  Findings:  Gallbladder:  No gallstones, gallbladder wall thickening, or pericholecystic fluid.  Common bile duct:  Measures 3 mm.  Liver:  Within normal limits for parenchymal echogenicity.  No focal hepatic lesion is seen.  IMPRESSION: Negative right upper quadrant ultrasound.                    Original Report Authenticated By: Charline Bills, M.D.       Medications: I have reviewed the patient's current medications.  Impression: 1. Acute pancreatitis of unclear etiology. 2. Hyperglycemia, hemoglobin A1c 5.8. Patient is glucose  intolerance/prediabetic.     Plan: 1. Progress diet to a low-fat diet for now. 2. Gastroenterology consultation per patient and wife's request. 3. Reduce intravenous opioids doses and frequency.     LOS: 2 days   Wilson Singer Pager [phone]  02/08/2012, 9:45 AM

## 2012-02-09 LAB — COMPREHENSIVE METABOLIC PANEL
Albumin: 3.1 g/dL — ABNORMAL LOW (ref 3.5–5.2)
Alkaline Phosphatase: 62 U/L (ref 39–117)
BUN: 8 mg/dL (ref 6–23)
CO2: 25 mEq/L (ref 19–32)
Chloride: 100 mEq/L (ref 96–112)
Creatinine, Ser: 0.91 mg/dL (ref 0.50–1.35)
GFR calc non Af Amer: >90 mL/min (ref >90)
Glucose, Bld: 124 mg/dL — ABNORMAL HIGH (ref 70–99)
Potassium: 4.1 mEq/L (ref 3.5–5.1)
Total Bilirubin: 0.7 mg/dL (ref 0.3–1.2)

## 2012-02-09 LAB — CBC
HCT: 40.1 % (ref 39.0–52.0)
Hemoglobin: 14.3 g/dL (ref 13.0–17.0)
MCV: 84.8 fL (ref 78.0–100.0)
RBC: 4.73 MIL/uL (ref 4.22–5.81)
WBC: 10.8 10*3/uL — ABNORMAL HIGH (ref 4.0–10.5)

## 2012-02-09 MED ORDER — OXYCODONE HCL 5 MG PO TABS: 5.0000 mg | ORAL_TABLET | ORAL | Status: DC | PRN

## 2012-02-09 MED ORDER — PANTOPRAZOLE SODIUM 40 MG PO TBEC: 40.0000 mg | DELAYED_RELEASE_TABLET | Freq: Every day | ORAL | Status: DC

## 2012-02-09 NOTE — Discharge Summary (Signed)
Physician Discharge Summary  James Underwood ZOX:[id number] DOB: Dec 29, 1970 DOA: 02/06/2012  PCP: Harlow Asa, MD  Admit date: 02/06/2012 Discharge date: 02/09/2012  Recommendations for Outpatient Follow-up:  Followup with Dr. Kendell Bane, gastroenterology, in 4-6 weeks.  Discharge Diagnoses:  1. Acute pancreatitis, unclear etiology, resolving. 2. Dehydration, resolved.   Discharge Condition: Stable and improved.  Diet recommendation: Regular.  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 Please see initial history as outlined below: James Underwood is an 41 y.o. male. 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 She's had episodic vomiting for the past couple of days and eventually presented to the emergency room where a CT scan of the abdomen was done which showed evidence of acute pancreatitis,  He drinks about 5 beers per week  Hospital Course:  Patient was admitted and treated conservatively with intravenous fluids and n.p.o. He did well and diet was gradually advanced. Ultrasound of the abdomen did not show any presence of gallstones or gallbladder pathology. His triglyceride level was normal. There is no significant history of alcohol excess. He was seen by gastroenterology, Dr. Kendell Bane who suggested followup in 4-6 weeks for endoscopic ultrasound. He is tolerated an advanced diet and is stable to be discharged home  today.  Procedures:  None.  Consultations:  Gastroenterology, Dr. Kendell Bane.  Discharge Exam: Filed Vitals:   02/08/12 1840 02/08/12 2033 02/09/12 0441 02/09/12 0611  BP: 144/89 129/87  145/84  Pulse: 105 92  85  Temp: 100.6 F (38.1 C) 99.9 F (37.7 C)  98.3 F (36.8 C)  TempSrc: Oral Oral  Oral  Resp: 20 18  20   Height:      Weight:   94.031 kg (207 lb 4.8 oz)   SpO2: 96% 93%  94%    General: He looks systemically well. Cardiovascular: Heart sounds are present and normal without murmurs. Respiratory: Lung fields are clear. Abdomen is soft and nontender.  Discharge Instructions  Discharge Orders    Future Orders Please Complete By Expires   Diet - low sodium heart healthy      Increase activity slowly          Medication List     As of 02/09/2012  8:41 AM    TAKE these medications         oxyCODONE 5 MG immediate release tablet   Commonly known as: Oxy IR/ROXICODONE   Take 1 tablet (5 mg total) by mouth every 4 (four) hours as needed for pain.      pantoprazole 40 MG tablet   Commonly known as: PROTONIX   Take 1 tablet (40 mg total) by mouth daily.           Follow-up Information    Follow up with Eula Listen, MD. Schedule an appointment as soon as possible for a visit in 4 weeks.   Contact information:   [address] [place] [address] [phone]  The results of significant diagnostics from this hospitalization (including imaging, microbiology, ancillary and laboratory) are listed below for reference.    Significant Diagnostic Studies: Ct Abdomen Pelvis W Contrast  02/06/2012  *RADIOLOGY REPORT*  Clinical Data: 41 year old male abdominal pain and anorexia nausea.  CT ABDOMEN AND PELVIS WITH CONTRAST  Technique:  Multidetector CT imaging of the abdomen and pelvis was performed following the standard protocol during bolus administration of intravenous contrast.  Contrast: OMNIPAQUE IOHEXOL 300  MG/ML  SOLN  Comparison: None.  Findings: No pericardial or pleural effusion.  Minor atelectasis at the lung bases. No acute osseous abnormality identified.  No pelvic free fluid.  Negative bladder and distal colon.  Normal proximal colon and appendix.  Oral contrast has not yet reached the terminal ileum.  No dilated or inflamed small bowel loops identified.  The stomach is distended with contrast in a air, otherwise negative.  The duodenum is within normal limits. Negative gallbladder, liver, spleen, adrenal glands, portal venous system, and kidneys.  The head and uncinate process of the pancreas are mildly inflamed. With stranding mostly involving the right root of the small bowel mesentery on series 2 image 36.  No significant changes in the duodenum at this time.  The body and tail of the pancreas appear more normal.  No pancreatic or biliary ductal dilatation.  Major arterial structures are patent.  No abdominal free fluid.  No lymphadenopathy.  IMPRESSION: 1.  Acute pancreatitis.  Inflammatory changes limited to the pancreatic head and uncinate at this time and no complicating features. 2.  Normal appendix. 3.  Mild atelectasis.   Original Report Authenticated By: Harley Hallmark, M.D.    US Abdomen Limited Ruq  02/07/2012  *RADIOLOGY REPORT*  Clinical Data:  Pancreatitis, evaluate for gallstones  LIMITED ABDOMINAL ULTRASOUND - RIGHT UPPER QUADRANT  Comparison:  CT abdomen pelvis dated 02/06/2012  Findings:  Gallbladder:  No gallstones, gallbladder wall thickening, or pericholecystic fluid.  Common bile duct:  Measures 3 mm.  Liver:  Within normal limits for parenchymal echogenicity.  No focal hepatic lesion is seen.  IMPRESSION: Negative right upper quadrant ultrasound.                    Original Report Authenticated By: Charline Bills, M.D.        Labs: Basic Metabolic Panel:  Lab 02/09/12 7829 02/08/12 0519 02/07/12 0519 02/06/12 2014  NA 133* 133* 136 132*  K 4.1 4.3 4.2 3.5  CL 100 100  102 96  CO2 25 26 26 26   GLUCOSE 124* 99 111* 113*  BUN 8 8 6 9   CREATININE 0.91 0.92 0.87 0.87  CALCIUM 9.3 9.1 8.9 9.4  MG -- -- 2.1 --  PHOS -- -- -- --   Liver Function Tests:  Lab 02/09/12 0455 02/08/12 0519 02/07/12 0519 02/06/12 2014  AST 12 16 20 22   ALT 14 17 21 25   ALKPHOS 62 70 81 70  BILITOT 0.7 0.8 0.6 0.6  PROT 7.5 7.2 7.2 7.8  ALBUMIN 3.1* 3.3* 3.5 4.0    Lab 02/07/12 0519 02/06/12 2014  LIPASE 56 70*  AMYLASE -- --    CBC:  Lab 02/09/12 0455 02/08/12 0519 02/07/12 0519 02/06/12 2014  WBC 10.8* 11.8* 12.0* 13.9*  NEUTROABS -- -- -- 12.0*  HGB 14.3 14.7 14.7 15.5  HCT 40.1 41.7 41.2 42.9  MCV 84.8 85.3 84.3 83.3  PLT 144* 136* 131* 128*     Time coordinating discharge: *Greater than 30  minutes  Signed:  Wilson Singer  [facility] 02/09/2012, 8:41 AM

## 2012-02-09 NOTE — Progress Notes (Signed)
Pt. Discharged via wheelchair to personal vehicle. Currently voices no c/o pain or discomfort. Discharge instructions and meds reviewed with pt with good understanding.

## 2012-02-09 NOTE — Consult Note (Signed)
REVIEWED. AGREE. 

## 2012-02-11 ENCOUNTER — Telehealth: Payer: Self-pay | Admitting: Gastroenterology

## 2012-02-11 NOTE — Telephone Encounter (Signed)
Patient is scheduled with Dr. Dulce Sellar on Friday Oct 25th 2013 at 2:00 and he is aware

## 2012-02-11 NOTE — Telephone Encounter (Signed)
Message copied by Glendora Score on Fri Feb 11, 2012  8:27 AM ------      Message from: Jonette Eva L      Created: Wed Feb 09, 2012 12:05 PM       PT NEEDS EUS WITH DR. Dulce Sellar IN 4-6 WEEKS, DX: IDIOPATHIC PANCREATITIS

## 2012-03-22 ENCOUNTER — Encounter (HOSPITAL_COMMUNITY): Payer: Self-pay | Admitting: Anesthesiology

## 2012-03-22 ENCOUNTER — Encounter (HOSPITAL_COMMUNITY)
Admission: RE | Disposition: A | Discharge status: Home / Self Care | Payer: Self-pay | Source: Ambulatory Visit | Attending: Gastroenterology

## 2012-03-22 ENCOUNTER — Encounter (HOSPITAL_COMMUNITY): Payer: Self-pay

## 2012-03-22 ENCOUNTER — Ambulatory Visit (HOSPITAL_COMMUNITY): Payer: 59 | Admitting: Anesthesiology

## 2012-03-22 ENCOUNTER — Ambulatory Visit (HOSPITAL_COMMUNITY)
Admission: RE | Admit: 2012-03-22 | Discharge: 2012-03-22 | Disposition: A | Discharge status: Home / Self Care | Payer: 59 | Source: Ambulatory Visit | Attending: Gastroenterology | Admitting: Gastroenterology

## 2012-03-22 DIAGNOSIS — K859 Acute pancreatitis without necrosis or infection, unspecified: Secondary | ICD-10-CM | POA: Insufficient documentation

## 2012-03-22 DIAGNOSIS — K838 Other specified diseases of biliary tract: Secondary | ICD-10-CM | POA: Insufficient documentation

## 2012-03-22 HISTORY — DX: Acute pancreatitis without necrosis or infection, unspecified: K85.90

## 2012-03-22 HISTORY — PX: EUS: SHX5427

## 2012-03-22 SURGERY — UPPER ENDOSCOPIC ULTRASOUND (EUS) RADIAL
Anesthesia: Monitor Anesthesia Care

## 2012-03-22 MED ORDER — MIDAZOLAM HCL 5 MG/5ML IJ SOLN
INTRAMUSCULAR | Status: DC | PRN
  Administered 2012-03-22: 1 mg via INTRAVENOUS

## 2012-03-22 MED ORDER — KETAMINE HCL 10 MG/ML IJ SOLN
INTRAMUSCULAR | Status: DC | PRN
  Administered 2012-03-22: 32 mg via INTRAVENOUS

## 2012-03-22 MED ORDER — ONDANSETRON HCL 4 MG/2ML IJ SOLN
INTRAMUSCULAR | Status: DC | PRN
  Administered 2012-03-22: 4 mg via INTRAVENOUS

## 2012-03-22 MED ORDER — LACTATED RINGERS IV SOLN
INTRAVENOUS | Status: DC | PRN
  Administered 2012-03-22: 09:00:00 via INTRAVENOUS

## 2012-03-22 MED ORDER — LIDOCAINE HCL (CARDIAC) 20 MG/ML IV SOLN
INTRAVENOUS | Status: DC | PRN
  Administered 2012-03-22: 100 mg via INTRAVENOUS

## 2012-03-22 MED ORDER — BUTAMBEN-TETRACAINE-BENZOCAINE 2-2-14 % EX AERO
INHALATION_SPRAY | CUTANEOUS | Status: DC | PRN
  Administered 2012-03-22: 2 via TOPICAL

## 2012-03-22 MED ORDER — FENTANYL CITRATE 0.05 MG/ML IJ SOLN
INTRAMUSCULAR | Status: DC | PRN
  Administered 2012-03-22: 50 ug via INTRAVENOUS

## 2012-03-22 MED ORDER — SODIUM CHLORIDE 0.9 % IV SOLN: INTRAVENOUS | Status: DC

## 2012-03-22 MED ORDER — PROPOFOL INFUSION 10 MG/ML OPTIME
INTRAVENOUS | Status: DC | PRN
  Administered 2012-03-22: 160 ug/kg/min via INTRAVENOUS

## 2012-03-22 NOTE — H&P (Signed)
Patient interval history reviewed.  Patient examined again.  There has been no change from documented H/P dated 02/22/12 (scanned into chart from our office) except as documented above.  Assessment:  1.  Acute idiopathic pancreatitis.  CT/US imaging gallbladder negative.  Minimal consumption of alcohol.  Plan:  1.  Upper endoscopic ultrasound with possible fine needle aspiration (FNA) biopsies. 2.  Risks (bleeding, infection, bowel perforation that could require surgery, sedation-related changes in cardiopulmonary systems), benefits (identification and possible treatment of source of symptoms, exclusion of certain causes of symptoms), and alternatives (watchful waiting, radiographic imaging studies, empiric medical treatment) of upper endoscopy with ultrasound and possible biopsies (EUS +/- FNA) were explained to patient in detail and he wishes to proceed.

## 2012-03-22 NOTE — Op Note (Signed)
[facility] [address] [place], [address]   ENDOSCOPIC ULTRASOUND PROCEDURE REPORT  PATIENT: Grason, Drawhorn  MR#: [id number] BIRTHDATE: 03-30-1971  GENDER: Male ENDOSCOPIST: Willis Modena, MD REFERRED BY:  Jonette Eva, M.D.; Ardyth Gal, MD PROCEDURE DATE:  03/22/2012 PROCEDURE:   Upper EUS ASA CLASS:      Class I INDICATIONS:   1.  acute idiopathic pancreatitis. MEDICATIONS: MAC sedation, administered by CRNA and Cetacaine spray x 2  DESCRIPTION OF PROCEDURE:   After the risks benefits and alternatives of the procedure were  explained, informed consent was obtained. The patient was then placed in the left, lateral, decubitus postion and IV sedation was administered. Throughout the procedure, the patients blood pressure, pulse and oxygen saturations were monitored continuously.  Under direct visualization, the     endoscope was introduced through the mouth and advanced to the second portion of the duodenum .  Water was used as necessary to provide an acoustic interface.  Upon completion of the imaging, water was removed and the patient was sent to the recovery room in satisfactory condition.    FINDINGS:      Hyperechoic body and tail of pancreas, consistent with fatty pancreas.  Residual inflammatory changes of head of pancreas.  Normal uncinate pancreas and ampulla.  Normal caliber common bile duct without evidence of asymmetrical wall thickening or choledocholithiasis.  Gallbladder sludge noted.   No pancreatic mass, cyst, or peripancreatic adenopathy was identified.  It should be noted that sensitivity of detecting pancreatic masses, however, in setting of recent prior pancreatitis is somewhat diminished.   IMPRESSION:     As above.  Gallbladder sludge raises possibility of gallbladder-related etiology to his pancreatitis.  RECOMMENDATIONS:     1.  Watch for potential complications of procedure. 2.  Will discuss findings with Dr. Darrick Penna.   Since patient is doing well, I don't think we should necessary feel compelled to pursue cholecystectomy.  However, if patient has another attack of pancreatitis, certainly consideration of cholecystectomy should be pursued.   _______________________________ Rosalie DoctorWillis Modena, MD 03/22/2012 10:01 AM   CC:

## 2012-03-22 NOTE — Anesthesia Postprocedure Evaluation (Signed)
  Anesthesia Post-op Note  Patient: James Underwood  Procedure(s) Performed: Procedure(s) (LRB): UPPER ENDOSCOPIC ULTRASOUND (EUS) RADIAL (N/A)  Patient Location: PACU  Anesthesia Type: MAC  Level of Consciousness: awake and alert   Airway and Oxygen Therapy: Patient Spontanous Breathing  Post-op Pain: mild  Post-op Assessment: Post-op Vital signs reviewed, Patient's Cardiovascular Status Stable, Respiratory Function Stable, Patent Airway and No signs of Nausea or vomiting  Post-op Vital Signs: stable  Complications: No apparent anesthesia complications

## 2012-03-22 NOTE — Preoperative (Signed)
Beta Blockers   Reason not to administer Beta Blockers:Not Applicable 

## 2012-03-22 NOTE — Anesthesia Preprocedure Evaluation (Signed)
Anesthesia Evaluation  Patient identified by MRN, date of birth, ID band Patient awake    Reviewed: Allergy & Precautions, H&P , NPO status , Patient's Chart, lab work & pertinent test results  Airway Mallampati: II TM Distance: <3 FB Neck ROM: Full    Dental No notable dental hx.    Pulmonary neg pulmonary ROS,  breath sounds clear to auscultation  Pulmonary exam normal       Cardiovascular negative cardio ROS  Rhythm:Regular Rate:Normal     Neuro/Psych negative neurological ROS  negative psych ROS   GI/Hepatic Neg liver ROS, GERD-  Medicated,  Endo/Other  negative endocrine ROS  Renal/GU negative Renal ROS  negative genitourinary   Musculoskeletal negative musculoskeletal ROS (+)   Abdominal   Peds negative pediatric ROS (+)  Hematology negative hematology ROS (+)   Anesthesia Other Findings   Reproductive/Obstetrics negative OB ROS                           Anesthesia Physical Anesthesia Plan  ASA: I  Anesthesia Plan: MAC   Post-op Pain Management:    Induction:   Airway Management Planned: Nasal Cannula  Additional Equipment:   Intra-op Plan:   Post-operative Plan:   Informed Consent: I have reviewed the patients History and Physical, chart, labs and discussed the procedure including the risks, benefits and alternatives for the proposed anesthesia with the patient or authorized representative who has indicated his/her understanding and acceptance.     Plan Discussed with: CRNA and Surgeon  Anesthesia Plan Comments:         Anesthesia Quick Evaluation

## 2012-03-22 NOTE — Transfer of Care (Signed)
Immediate Anesthesia Transfer of Care Note  Patient: James Underwood  Procedure(s) Performed: Procedure(s) (LRB): UPPER ENDOSCOPIC ULTRASOUND (EUS) RADIAL (N/A)  Patient Location: PACU  Anesthesia Type: MAC  Level of Consciousness: sedated, patient cooperative and responds to stimulaton  Airway & Oxygen Therapy: Patient Spontanous Breathing and Patient connected to face mask oxgen  Post-op Assessment: Report given to PACU RN and Post -op Vital signs reviewed and stable  Post vital signs: Reviewed and stable  Complications: No apparent anesthesia complications

## 2012-03-23 ENCOUNTER — Encounter (HOSPITAL_COMMUNITY): Payer: Self-pay | Admitting: Gastroenterology

## 2012-10-31 ENCOUNTER — Telehealth: Payer: Self-pay | Admitting: Family Medicine

## 2012-10-31 MED ORDER — TRIAMCINOLONE ACETONIDE 0.1 % EX CREA: TOPICAL_CREAM | Freq: Two times a day (BID) | CUTANEOUS | Status: DC

## 2012-10-31 NOTE — Telephone Encounter (Signed)
Patients wife says that dogs have given him poison ivy and Eber Jones told her that if she ever needed anymore of the cream that she prescribes(pt cant remember name) then she will gladly call him in some to [facility]

## 2012-10-31 NOTE — Telephone Encounter (Signed)
Med same as wife's per Dr. Brett Canales

## 2012-10-31 NOTE — Telephone Encounter (Signed)
Med sent electronically to [facility] [place]. Patient notified. 

## 2012-10-31 NOTE — Telephone Encounter (Signed)
     Triamcinolone cr .1 per cent bid 60 g two ref

## 2012-11-14 ENCOUNTER — Ambulatory Visit (INDEPENDENT_AMBULATORY_CARE_PROVIDER_SITE_OTHER): Payer: 59 | Admitting: Family Medicine

## 2012-11-14 ENCOUNTER — Encounter: Payer: Self-pay | Admitting: Family Medicine

## 2012-11-14 ENCOUNTER — Ambulatory Visit (HOSPITAL_COMMUNITY)
Admission: RE | Admit: 2012-11-14 | Discharge: 2012-11-14 | Disposition: A | Discharge status: Home / Self Care | Payer: 59 | Source: Ambulatory Visit | Attending: Family Medicine | Admitting: Family Medicine

## 2012-11-14 ENCOUNTER — Other Ambulatory Visit: Payer: Self-pay | Admitting: *Deleted

## 2012-11-14 VITALS — BP 130/88 | HR 80 | Wt 198.0 lb

## 2012-11-14 DIAGNOSIS — M25571 Pain in right ankle and joints of right foot: Secondary | ICD-10-CM

## 2012-11-14 DIAGNOSIS — M25579 Pain in unspecified ankle and joints of unspecified foot: Secondary | ICD-10-CM | POA: Insufficient documentation

## 2012-11-14 DIAGNOSIS — S93409A Sprain of unspecified ligament of unspecified ankle, initial encounter: Secondary | ICD-10-CM
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
## 2013-03-22 ENCOUNTER — Other Ambulatory Visit: Payer: Self-pay

## 2014-03-03 IMAGING — US US ABDOMEN LIMITED
1 series · 14 of 25 positions shown · non-contrast
Comparison: CT abdomen pelvis dated 02/06/2012

CLINICAL DATA: Pancreatitis, evaluate for gallstones

LIMITED ABDOMINAL ULTRASOUND - RIGHT UPPER QUADRANT

[Series 1: us abdomen limited · 0.23mm/px · 14 of 31 slices shown]
[im 1/31]
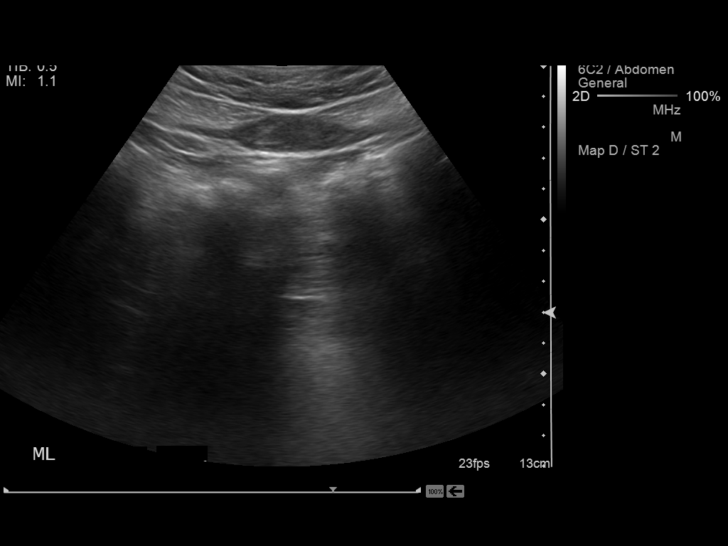
[im 3/31]
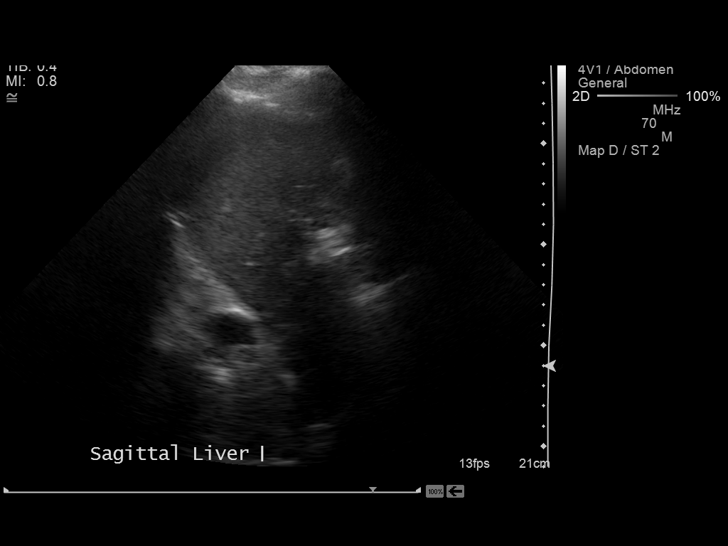
[im 6/31]
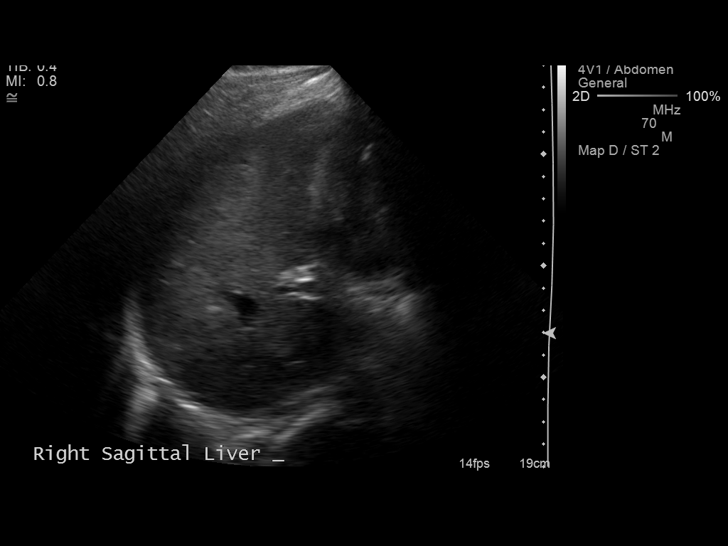
[im 8/31]
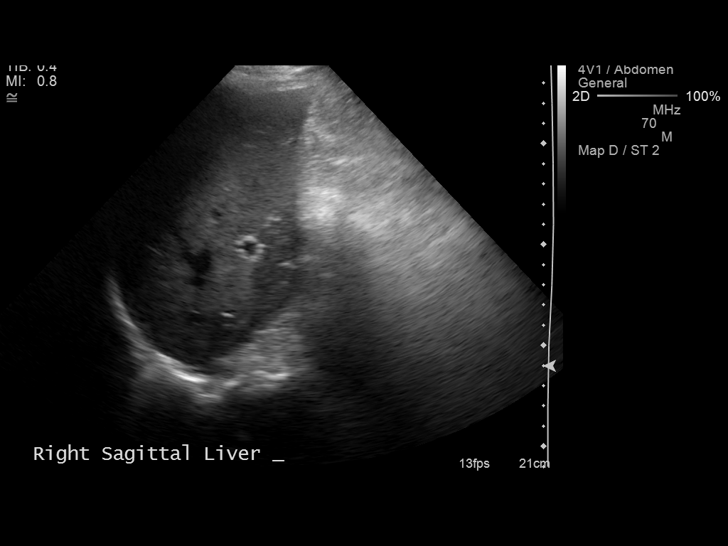
[im 11/31]
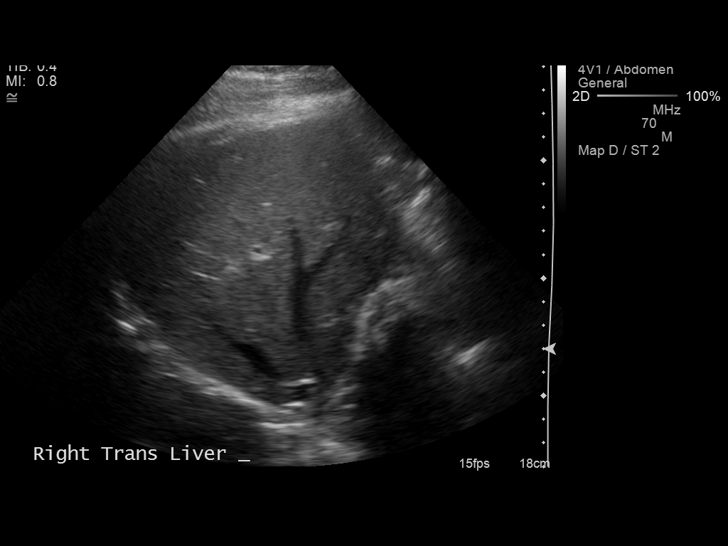
[im 12/31]
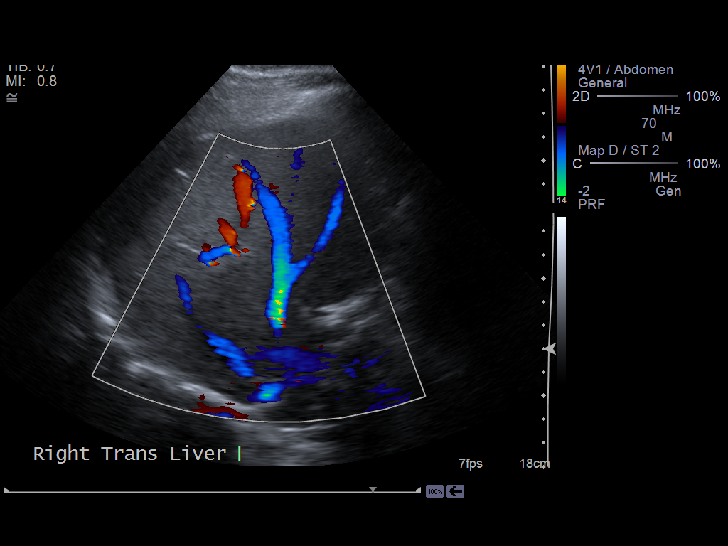
[im 14/31]
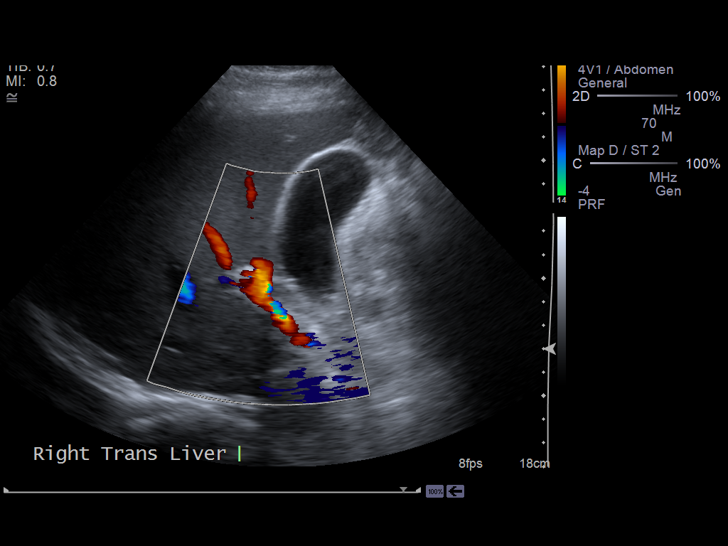
[im 17/31]
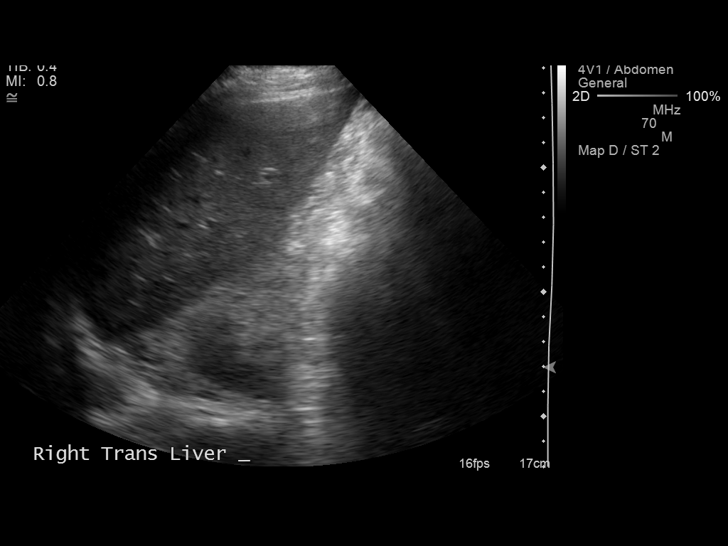
[im 19/31]
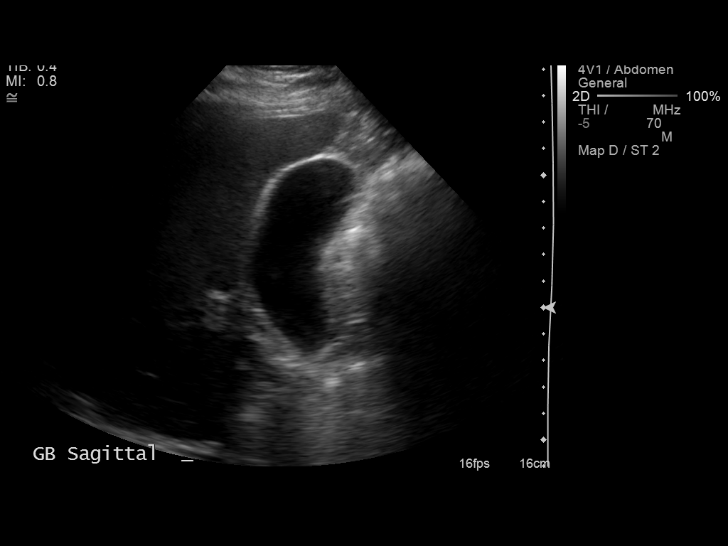
[im 21/31]
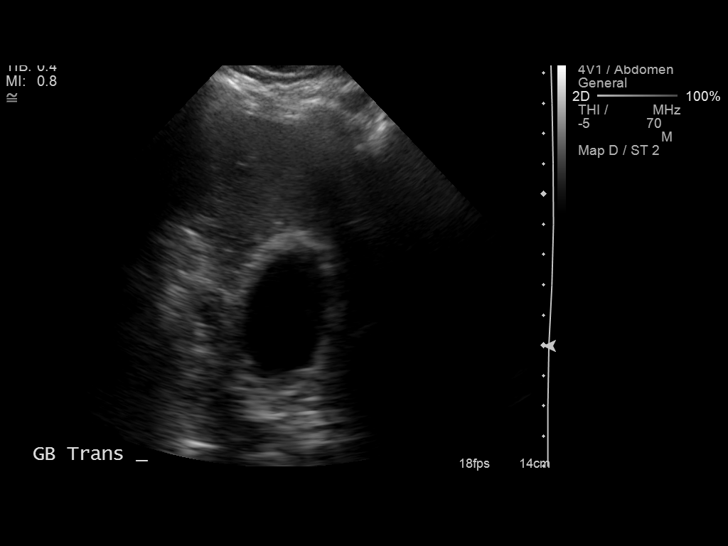
[im 23/31]
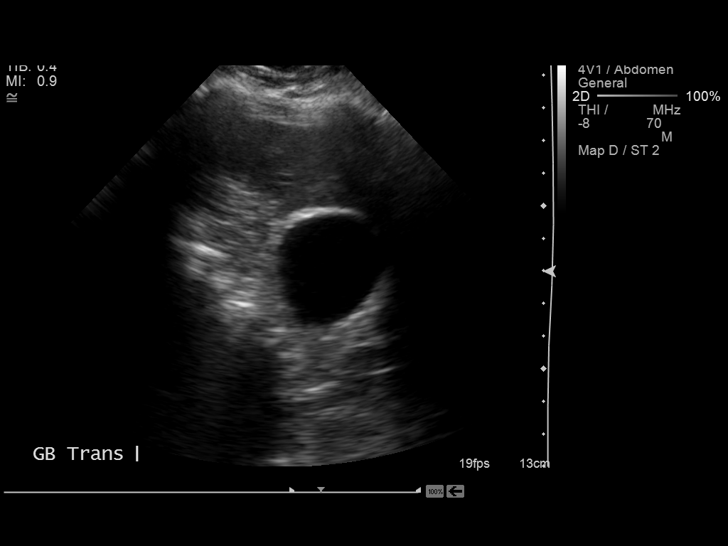
[im 26/31]
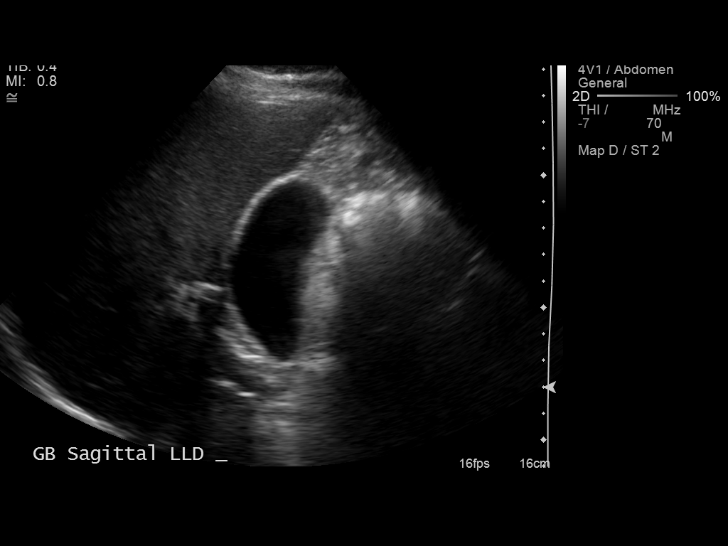
[im 28/31]
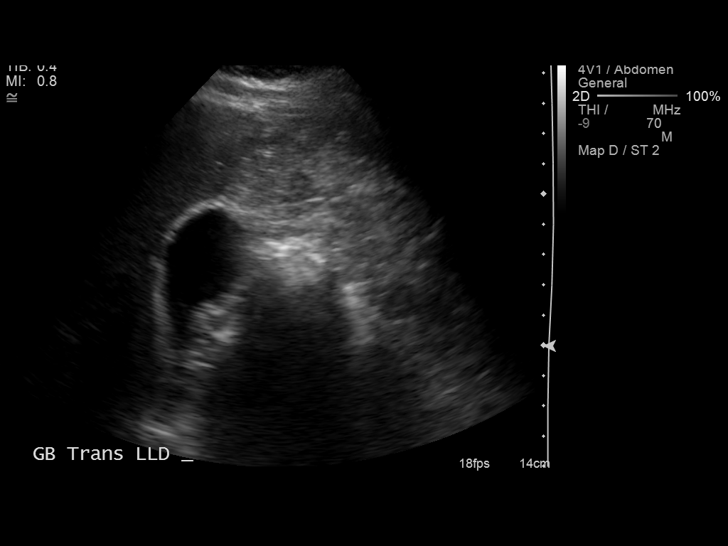
[im 31/31]
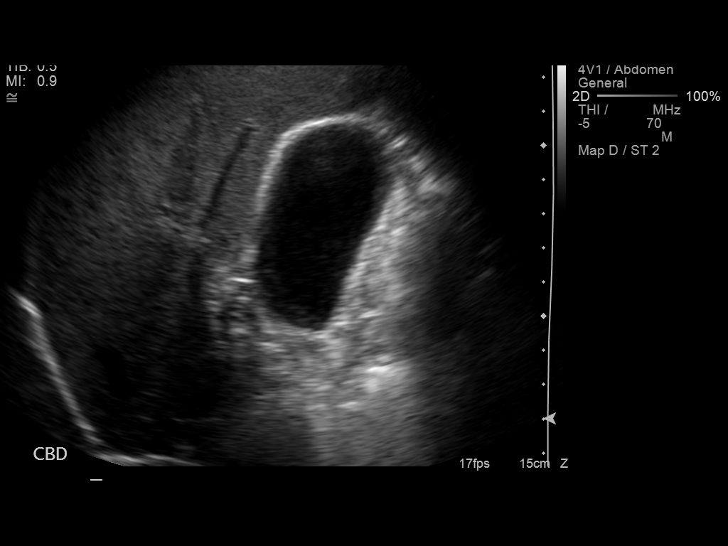

[14 of 25 positions shown; findings below may reference images not displayed]

FINDINGS: Gallbladder:  No gallstones, gallbladder wall thickening, or
pericholecystic fluid.

Common bile duct:  Measures 3 mm.

Liver:  Within normal limits for parenchymal echogenicity.  No
focal hepatic lesion is seen.
IMPRESSION: Negative right upper quadrant ultrasound.

## 2014-04-03 ENCOUNTER — Encounter: Payer: Self-pay | Admitting: Family Medicine

## 2014-04-03 ENCOUNTER — Ambulatory Visit (INDEPENDENT_AMBULATORY_CARE_PROVIDER_SITE_OTHER): Payer: 59 | Admitting: Family Medicine

## 2014-04-03 VITALS — BP 122/80 | Temp 98.3°F | Ht 64.0 in | Wt 207.0 lb

## 2014-04-03 DIAGNOSIS — L03116 Cellulitis of left lower limb: Secondary | ICD-10-CM

## 2014-04-03 MED ORDER — DOXYCYCLINE HYCLATE 100 MG PO TABS: 100.0000 mg | ORAL_TABLET | Freq: Two times a day (BID) | ORAL | Status: DC

## 2014-04-03 NOTE — Progress Notes (Signed)
   Subjective:    Patient ID: James MacadamiaJason P Underwood, male    DOB: 11/25/1970, 43 y.o.   MRN: [id number]  HPIleg leg pain and swelling. Pt  States he pulled a thorn out 2 days ago. Taking ibuprofen. Patient notes no fever or chills.  Notes redness and swelling. Some tenderness.  Had a mild achy sensation.  No significant injury may have had a foreign body not for sure. May have had a bite.   Review of Systems no high fevers no chills no vomiting no headache ROS otherwise negative    Objective:   Physical Exam   alert vital stable. HEENT normal. Lungs clear. Heart regular rate and rhythm. Leg patchy erythematous region quite tender with central site of skin break       Assessment & Plan:   impression cellulitis at site of skin when plan doxy twice a day. Symptomatic care discussed. WSL

## 2014-04-24 ENCOUNTER — Ambulatory Visit: Payer: 59 | Admitting: *Deleted

## 2014-04-24 ENCOUNTER — Ambulatory Visit (INDEPENDENT_AMBULATORY_CARE_PROVIDER_SITE_OTHER): Payer: 59 | Admitting: *Deleted

## 2014-04-24 DIAGNOSIS — Z23 Encounter for immunization: Secondary | ICD-10-CM

## 2014-05-06 ENCOUNTER — Ambulatory Visit (INDEPENDENT_AMBULATORY_CARE_PROVIDER_SITE_OTHER): Payer: 59 | Admitting: Family Medicine

## 2014-05-06 ENCOUNTER — Encounter: Payer: Self-pay | Admitting: Family Medicine

## 2014-05-06 VITALS — BP 138/88 | Temp 97.6°F | Ht 71.0 in | Wt 214.0 lb

## 2014-05-06 DIAGNOSIS — R21 Rash and other nonspecific skin eruption: Secondary | ICD-10-CM

## 2014-05-06 MED ORDER — PREDNISONE 20 MG PO TABS: ORAL_TABLET | ORAL | Status: DC

## 2014-05-06 MED ORDER — TRIAMCINOLONE ACETONIDE 0.1 % EX CREA
1.0000 | TOPICAL_CREAM | Freq: Two times a day (BID) | CUTANEOUS | Status: DC
Start: 2014-05-06 — End: 2014-05-06

## 2014-05-06 MED ORDER — TRIAMCINOLONE ACETONIDE 0.1 % EX CREA: 1.0000 "application " | TOPICAL_CREAM | Freq: Two times a day (BID) | CUTANEOUS | Status: DC

## 2014-05-06 MED ORDER — METHYLPREDNISOLONE ACETATE 80 MG/ML IJ SUSP
80.0000 mg | Freq: Once | INTRAMUSCULAR | Status: AC
  Administered 2014-05-06: 80 mg via INTRAMUSCULAR

## 2014-05-06 NOTE — Progress Notes (Signed)
   Subjective:    Patient ID: James Underwood, male    DOB: 06/10/1970, 43 y.o.   MRN: [id number]  Rash This is a new problem. The current episode started in the past 7 days. Pain location: neck and face. The rash is characterized by itchiness and burning. Treatments tried: topical cream. The treatment provided no relief.   Not helping as far as triamcin having rough time with it  Patient notes severe itching and right lateral neck. Rash has extended into the shoulder and towards the scalp. Patient did get outside and worked with a lot of brush. Next  Significant history of rhus dermatitis   Review of Systems  Skin: Positive for rash.   No headache no cough no fever ROS otherwise negative    Objective:   Physical Exam  Alert no apparent distress HEENT normal lungs clear. Heart regular in rhythm.      Assessment & Plan:  Impression rash substantial. Likely contact dermatitis from poison oh or poison ivy plan options discussed. Depo-Medrol IM. Prednisone taper. Local agents. Warning signs discussed. WSL

## 2014-10-31 ENCOUNTER — Telehealth: Payer: Self-pay | Admitting: Family Medicine

## 2014-10-31 MED ORDER — PREDNISONE 20 MG PO TABS: ORAL_TABLET | ORAL | Status: DC

## 2014-10-31 NOTE — Telephone Encounter (Signed)
Pt has poison oak and would like for something to be called in.   [facility]

## 2014-10-31 NOTE — Telephone Encounter (Signed)
Adult pred taper 

## 2014-10-31 NOTE — Telephone Encounter (Signed)
Left message on voicemail notifying patient med sent to pharmacy.  

## 2014-11-04 ENCOUNTER — Other Ambulatory Visit: Payer: Self-pay | Admitting: Nurse Practitioner

## 2014-11-04 ENCOUNTER — Telehealth: Payer: Self-pay | Admitting: Family Medicine

## 2014-11-04 MED ORDER — TRIAMCINOLONE ACETONIDE 0.1 % EX CREA: 1.0000 "application " | TOPICAL_CREAM | Freq: Two times a day (BID) | CUTANEOUS | Status: DC

## 2014-11-04 NOTE — Telephone Encounter (Signed)
triamcinolone cream (KENALOG) 0.1 %  pts spouse states they still need this cream, a script For prednisone was called in but he really needs the cream too  [facility]

## 2014-12-09 IMAGING — CR DG ANKLE COMPLETE 3+V*R*
3 series · 3 of 3 positions shown · non-contrast
Comparison: None.

CLINICAL DATA: Pain post trauma

RIGHT ANKLE - COMPLETE 3+ VIEW

[view not recorded (1 of 3)]
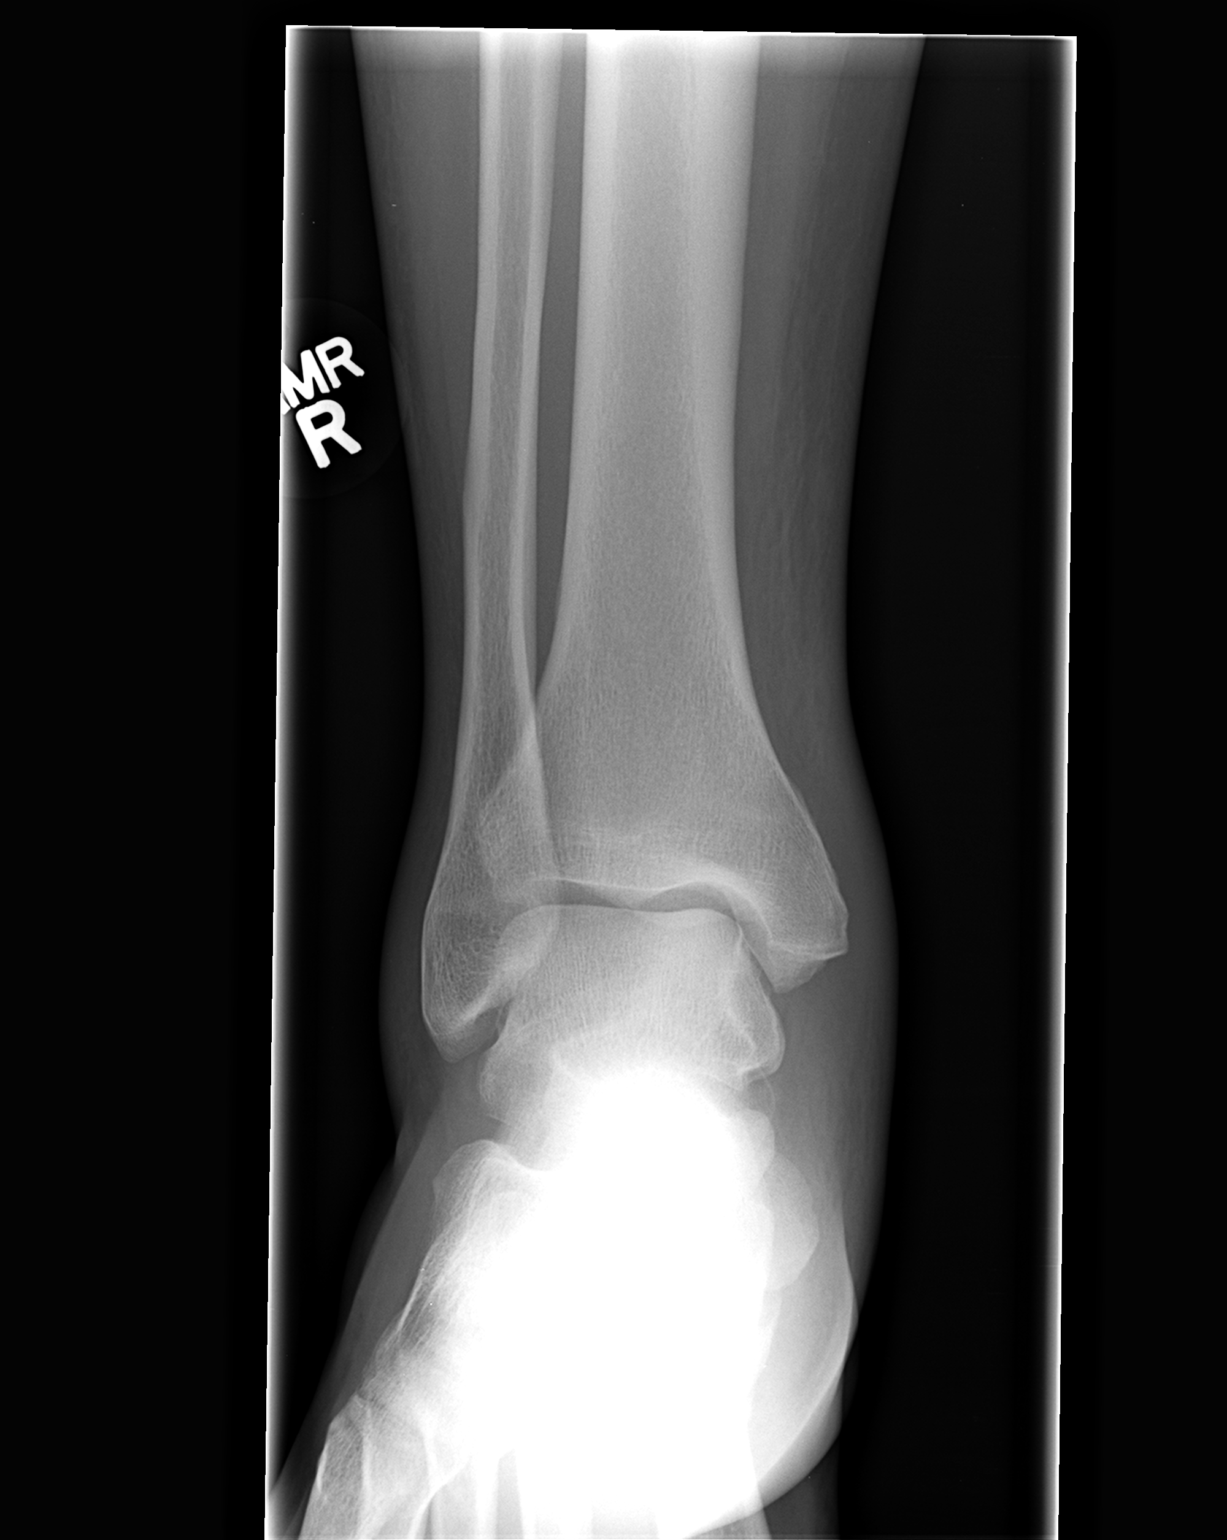

[view not recorded (2 of 3)]
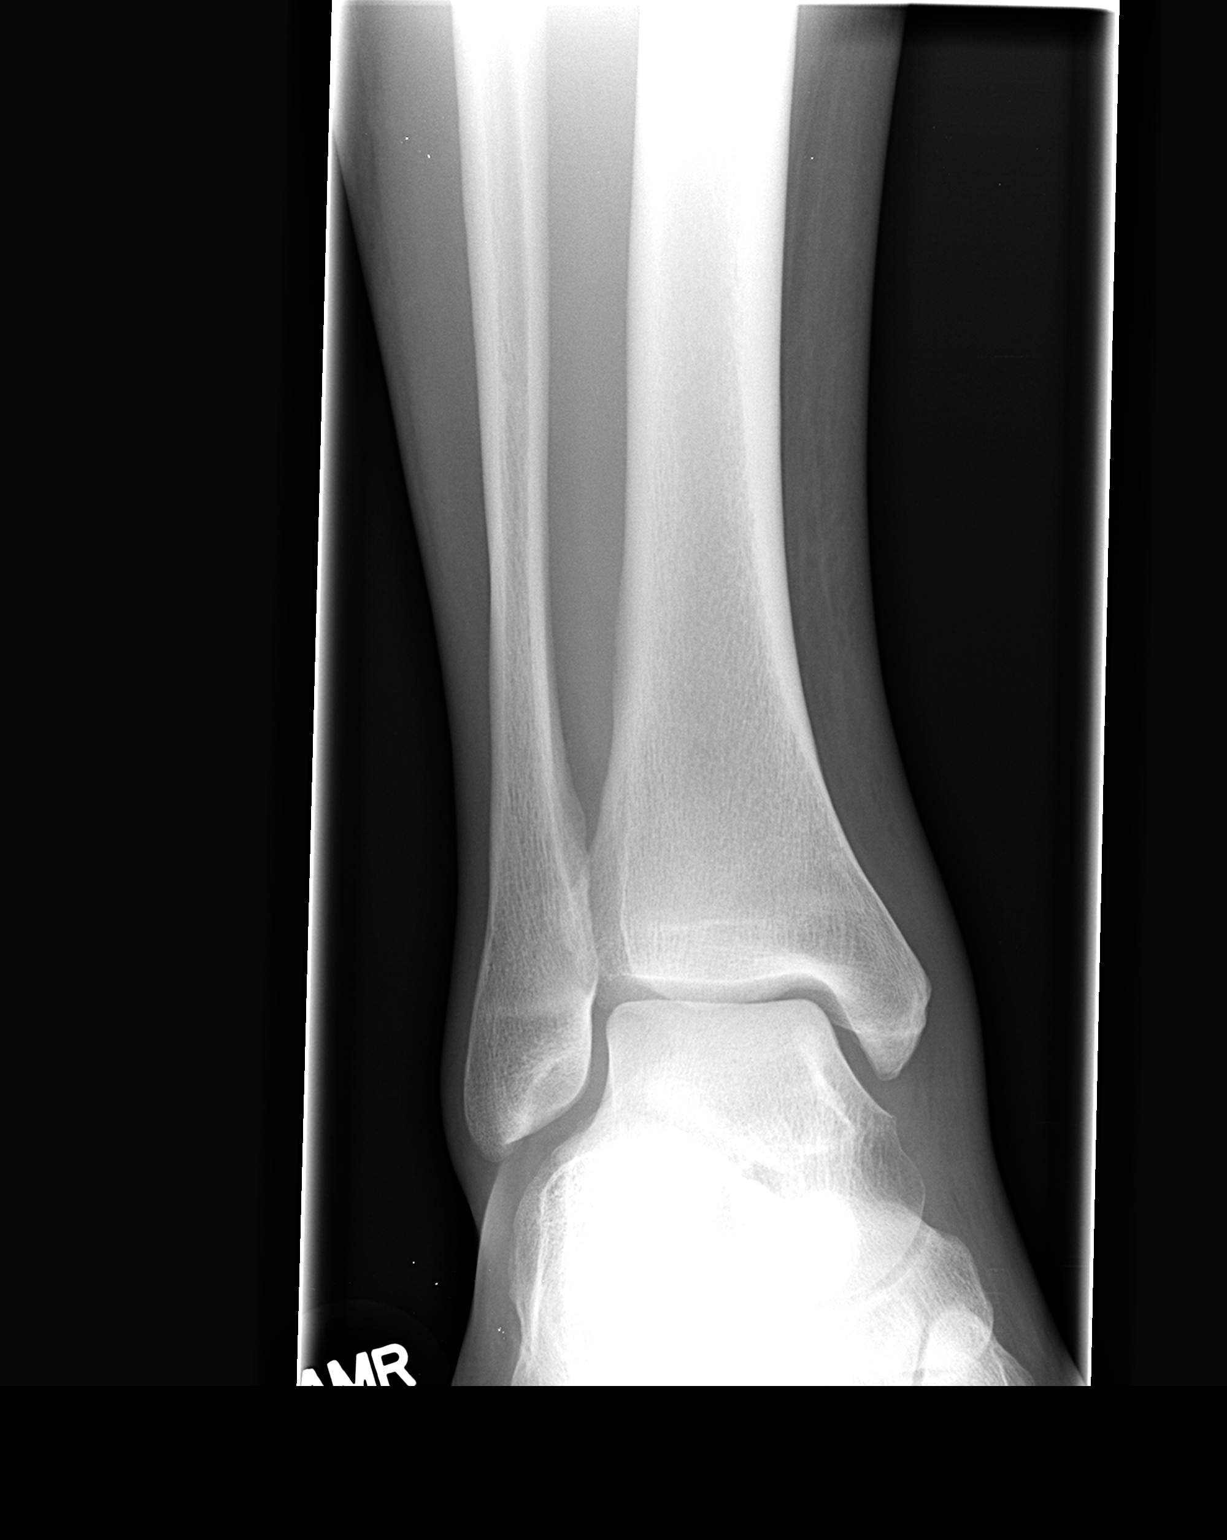

[view not recorded (3 of 3)]
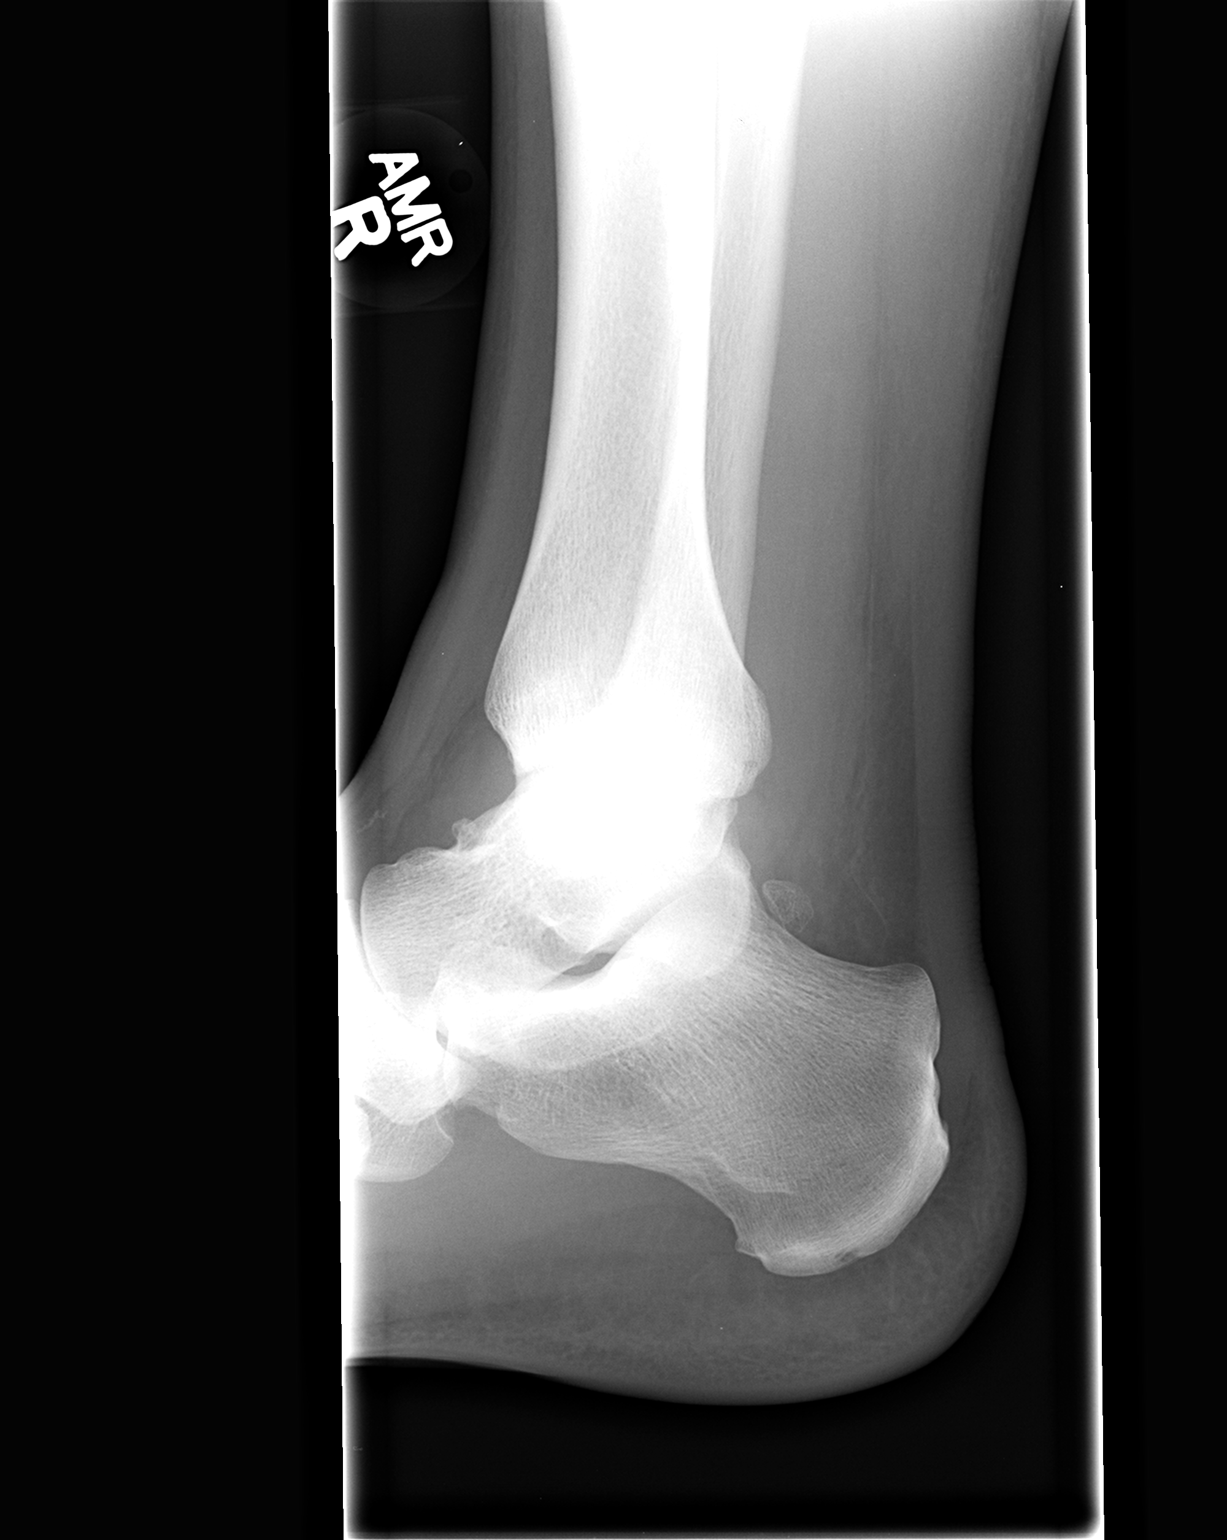

[3 of 3 positions shown; findings below may reference images not displayed]

FINDINGS: Frontal, oblique, and lateral views were obtained.  No
fracture or effusion.  Ankle mortise appears intact.  There is mild
soft tissue swelling.
IMPRESSION: Mild soft tissue swelling.  No apparent fracture.
Mortise intact.

## 2015-06-16 ENCOUNTER — Other Ambulatory Visit: Payer: Self-pay | Admitting: Nurse Practitioner

## 2015-06-16 MED ORDER — PREDNISONE 20 MG PO TABS: ORAL_TABLET | ORAL | Status: DC

## 2015-06-16 NOTE — Progress Notes (Signed)
Wife in today. States patient has poison ivy rash after working in the woods a few days ago. Will send in Rx for prednisone taper. Call back if persists.

## 2015-09-05 ENCOUNTER — Telehealth: Payer: Self-pay | Admitting: Family Medicine

## 2015-09-05 ENCOUNTER — Other Ambulatory Visit: Payer: Self-pay | Admitting: *Deleted

## 2015-09-05 MED ORDER — PREDNISONE 20 MG PO TABS: ORAL_TABLET | ORAL | Status: DC

## 2015-09-05 NOTE — Telephone Encounter (Signed)
Seen 12/21 for poison ivy

## 2015-09-05 NOTE — Telephone Encounter (Signed)
It would be fine to refill the prednisone medication that is on his Epic list-use prednisone taper if ongoing trouble follow-up

## 2015-09-05 NOTE — Telephone Encounter (Signed)
Med sent to pharm. Pt notified.  

## 2015-09-05 NOTE — Telephone Encounter (Signed)
Pt has poison ivy again, tried OTC but it's still spreading Wants a refill on the prednisone [facility]

## 2015-11-27 ENCOUNTER — Other Ambulatory Visit: Payer: Self-pay | Admitting: *Deleted

## 2015-11-27 ENCOUNTER — Telehealth: Payer: Self-pay | Admitting: Family Medicine

## 2015-11-27 MED ORDER — TRIAMCINOLONE ACETONIDE 0.1 % EX CREA: 1.0000 "application " | TOPICAL_CREAM | Freq: Two times a day (BID) | CUTANEOUS | Status: DC

## 2015-11-27 MED ORDER — PREDNISONE 20 MG PO TABS: ORAL_TABLET | ORAL | Status: DC

## 2015-11-27 NOTE — Telephone Encounter (Signed)
meds sent to pharm. Pt notified on voicemail.  

## 2015-11-27 NOTE — Telephone Encounter (Signed)
Ok lets do 

## 2015-11-27 NOTE — Telephone Encounter (Signed)
Pt is requesting a refill on his triamcinolone cream (KENALOG) 0.1 % and would also like prednisone called in due to having poison ivy.       [facility]

## 2016-01-23 ENCOUNTER — Other Ambulatory Visit: Payer: Self-pay | Admitting: Nurse Practitioner

## 2016-01-23 ENCOUNTER — Telehealth: Payer: Self-pay | Admitting: Nurse Practitioner

## 2016-01-23 MED ORDER — PREDNISONE 20 MG PO TABS: ORAL_TABLET | ORAL | 0 refills | Status: DC

## 2016-01-23 NOTE — Telephone Encounter (Signed)
Done. Call back if no improvement.

## 2016-01-23 NOTE — Telephone Encounter (Signed)
Left message informing patient requested medication was sent in.

## 2016-01-23 NOTE — Telephone Encounter (Signed)
Patient has poison ivy on his legs and arms.  Would like prednisone called in.  [facility]

## 2016-06-16 ENCOUNTER — Other Ambulatory Visit: Payer: Self-pay | Admitting: Nurse Practitioner

## 2016-06-16 ENCOUNTER — Telehealth: Payer: Self-pay | Admitting: Family Medicine

## 2016-06-16 MED ORDER — OSELTAMIVIR PHOSPHATE 75 MG PO CAPS: 75.0000 mg | ORAL_CAPSULE | Freq: Two times a day (BID) | ORAL | 0 refills | Status: DC

## 2016-06-16 NOTE — Telephone Encounter (Signed)
Patients daughter(James Underwood) seen James Underwood last week and was diagnosed with the flu.  James Underwood was told to call in if any other members of the family started showing symptoms and we could call something in.  James Underwood is having body aches, chills, low grade fever, sore throat, nausea, cough.  Requesting something to be called in.   [facility]

## 2016-06-16 NOTE — Telephone Encounter (Signed)
Left message on voicemail notifying wife med sent in

## 2016-06-16 NOTE — Telephone Encounter (Signed)
Med called in. Office visit if needed.  

## 2016-12-29 ENCOUNTER — Encounter: Payer: Self-pay | Admitting: Family Medicine

## 2016-12-29 ENCOUNTER — Ambulatory Visit (INDEPENDENT_AMBULATORY_CARE_PROVIDER_SITE_OTHER): Payer: 59 | Admitting: Family Medicine

## 2016-12-29 VITALS — BP 126/86 | Ht 71.0 in | Wt 193.1 lb

## 2016-12-29 DIAGNOSIS — M25511 Pain in right shoulder: Secondary | ICD-10-CM | POA: Diagnosis not present

## 2016-12-29 MED ORDER — CHLORZOXAZONE 500 MG PO TABS
500.0000 mg | ORAL_TABLET | Freq: Three times a day (TID) | ORAL | 0 refills | Status: DC | PRN
Start: 2016-12-29 — End: 2020-11-13
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
## 2017-03-08 ENCOUNTER — Telehealth: Payer: Self-pay | Admitting: Family Medicine

## 2017-03-08 MED ORDER — PREDNISONE 20 MG PO TABS: ORAL_TABLET | ORAL | 0 refills | Status: DC

## 2017-03-08 MED ORDER — TRIAMCINOLONE ACETONIDE 0.1 % EX CREA: 1.0000 "application " | TOPICAL_CREAM | Freq: Two times a day (BID) | CUTANEOUS | 2 refills | Status: DC

## 2017-03-08 NOTE — Telephone Encounter (Signed)
Adult pred taper, thirty g triamcin with two ref
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
## 2017-03-08 NOTE — Telephone Encounter (Signed)
Prescription sent electronically to pharmacy. Patient notified. 

## 2017-05-03 ENCOUNTER — Telehealth: Payer: Self-pay | Admitting: Family Medicine

## 2017-05-03 MED ORDER — OSELTAMIVIR PHOSPHATE 75 MG PO CAPS: 75.0000 mg | ORAL_CAPSULE | Freq: Two times a day (BID) | ORAL | 0 refills | Status: DC

## 2017-05-03 NOTE — Telephone Encounter (Signed)
Prescription sent electronically to pharmacy. Patient notified. 

## 2017-05-03 NOTE — Telephone Encounter (Signed)
Ok tamiflu 75 bid five d

## 2017-05-03 NOTE — Telephone Encounter (Signed)
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 Plus 2 other officers have tested positive as well.  Wife is pretty sure that is what patient has now.  Developed symptoms all at once yesterday.  She is a Engineer, civil (consulting)nurse and said she will bring him in if needed but was hoping he could get Tamiflu called in today so he can get started on it. Symptoms are:  Fatigue, fever, body aches,sorethroat,slight cough, all pretty much hit all at once yesterday. She is quarantining him from everyone.  [facility] [place]

## 2017-12-01 ENCOUNTER — Other Ambulatory Visit: Payer: Self-pay | Admitting: *Deleted

## 2017-12-01 ENCOUNTER — Telehealth: Payer: Self-pay | Admitting: Family Medicine

## 2017-12-01 MED ORDER — PREDNISONE 20 MG PO TABS: ORAL_TABLET | ORAL | 0 refills | Status: DC

## 2017-12-01 NOTE — Telephone Encounter (Signed)
Adult pred taper 

## 2017-12-01 NOTE — Telephone Encounter (Signed)
Patient has poison ivy on arms and upper chest.  He is requesting Rx for prednisone called in.  [facility] [address]

## 2017-12-01 NOTE — Telephone Encounter (Signed)
Med sent to pharm. Pt notified.  

## 2018-04-04 ENCOUNTER — Telehealth: Payer: Self-pay | Admitting: Family Medicine

## 2018-04-04 ENCOUNTER — Other Ambulatory Visit: Payer: Self-pay | Admitting: Family Medicine

## 2018-04-04 DIAGNOSIS — Z0000 Encounter for general adult medical examination without abnormal findings: Secondary | ICD-10-CM

## 2018-04-04 NOTE — Telephone Encounter (Signed)
Lip liv m7 psa cbc 

## 2018-04-04 NOTE — Telephone Encounter (Signed)
Pt has physical 04/10/2018 with Dr.Steve, requesting blood work. Advise.

## 2018-04-04 NOTE — Telephone Encounter (Signed)
Last labs were more than 5 years ago and none were ordered by office. Please advise. Thank you.

## 2018-04-04 NOTE — Telephone Encounter (Signed)
Lab orders placed. Pt wife notified. Will mail lab orders to patient per wife request.

## 2018-04-10 ENCOUNTER — Encounter: Payer: 59 | Admitting: Family Medicine

## 2018-04-24 DIAGNOSIS — Z0000 Encounter for general adult medical examination without abnormal findings: Secondary | ICD-10-CM | POA: Diagnosis not present

## 2018-04-25 LAB — CBC WITH DIFFERENTIAL/PLATELET
BASOS ABS: 0 10*3/uL (ref 0.0–0.2)
Basos: 1 %
EOS (ABSOLUTE): 0.4 10*3/uL (ref 0.0–0.4)
Eos: 6 %
Hematocrit: 44 % (ref 37.5–51.0)
Hemoglobin: 15.4 g/dL (ref 13.0–17.7)
Immature Grans (Abs): 0 10*3/uL (ref 0.0–0.1)
Immature Granulocytes: 0 %
LYMPHS ABS: 2.1 10*3/uL (ref 0.7–3.1)
Lymphs: 35 %
MCH: 30.3 pg (ref 26.6–33.0)
MCHC: 35 g/dL (ref 31.5–35.7)
MCV: 86 fL (ref 79–97)
MONOS ABS: 0.6 10*3/uL (ref 0.1–0.9)
Monocytes: 11 %
NEUTROS ABS: 2.8 10*3/uL (ref 1.4–7.0)
Neutrophils: 47 %
Platelets: 141 10*3/uL — ABNORMAL LOW (ref 150–450)
RBC: 5.09 x10E6/uL (ref 4.14–5.80)
RDW: 12.5 % (ref 12.3–15.4)
WBC: 5.8 10*3/uL (ref 3.4–10.8)

## 2018-04-25 LAB — BASIC METABOLIC PANEL
BUN / CREAT RATIO: 11 (ref 9–20)
BUN: 12 mg/dL (ref 6–24)
CHLORIDE: 103 mmol/L (ref 96–106)
CO2: 23 mmol/L (ref 20–29)
Calcium: 9.2 mg/dL (ref 8.7–10.2)
Creatinine, Ser: 1.05 mg/dL (ref 0.76–1.27)
GFR calc non Af Amer: 84 mL/min/{1.73_m2} (ref >59)
GFR, EST AFRICAN AMERICAN: 97 mL/min/{1.73_m2} (ref >59)
GLUCOSE: 91 mg/dL (ref 65–99)
Potassium: 4.5 mmol/L (ref 3.5–5.2)
SODIUM: 139 mmol/L (ref 134–144)

## 2018-04-25 LAB — HEPATIC FUNCTION PANEL
ALK PHOS: 69 IU/L (ref 39–117)
ALT: 21 IU/L (ref 0–44)
AST: 22 IU/L (ref 0–40)
Albumin: 4.2 g/dL (ref 3.5–5.5)
BILIRUBIN, DIRECT: 0.11 mg/dL (ref 0.00–0.40)
Bilirubin Total: 0.3 mg/dL (ref 0.0–1.2)
TOTAL PROTEIN: 6.6 g/dL (ref 6.0–8.5)

## 2018-04-25 LAB — PSA: PROSTATE SPECIFIC AG, SERUM: 1.2 ng/mL (ref 0.0–4.0)

## 2018-04-25 LAB — LIPID PANEL
CHOLESTEROL TOTAL: 218 mg/dL — AB (ref 100–199)
Chol/HDL Ratio: 3.9 ratio (ref 0.0–5.0)
HDL: 56 mg/dL (ref >39)
LDL CALC: 149 mg/dL — AB (ref 0–99)
TRIGLYCERIDES: 67 mg/dL (ref 0–149)
VLDL CHOLESTEROL CAL: 13 mg/dL (ref 5–40)

## 2018-05-01 ENCOUNTER — Encounter: Payer: Self-pay | Admitting: Family Medicine

## 2018-05-01 ENCOUNTER — Ambulatory Visit (INDEPENDENT_AMBULATORY_CARE_PROVIDER_SITE_OTHER): Payer: 59 | Admitting: Family Medicine

## 2018-05-01 VITALS — BP 128/82 | Ht 71.0 in | Wt 201.0 lb

## 2018-05-01 DIAGNOSIS — Z23 Encounter for immunization: Secondary | ICD-10-CM | POA: Diagnosis not present

## 2018-05-01 DIAGNOSIS — Z0000 Encounter for general adult medical examination without abnormal findings: Secondary | ICD-10-CM | POA: Diagnosis not present
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
## 2018-05-01 NOTE — Patient Instructions (Addendum)
High Cholesterol High cholesterol is a condition in which the blood has high levels of a white, waxy, fat-like substance (cholesterol). The human body needs small amounts of cholesterol. The liver makes all the cholesterol that the body needs. Extra (excess) cholesterol comes from the food that we eat. Cholesterol is carried from the liver by the blood through the blood vessels. If you have high cholesterol, deposits (plaques) may build up on the walls of your blood vessels (arteries). Plaques make the arteries narrower and stiffer. Cholesterol plaques increase your risk for heart attack and stroke. Work with your health care provider to keep your cholesterol levels in a healthy range. What increases the risk? This condition is more likely to develop in people who:  Eat foods that are high in animal fat (saturated fat) or cholesterol.  Are overweight.  Are not getting enough exercise.  Have a family history of high cholesterol.  What are the signs or symptoms? There are no symptoms of this condition. How is this diagnosed? This condition may be diagnosed from the results of a blood test.  If you are older than age 20, your health care provider may check your cholesterol every 4-6 years.  You may be checked more often if you already have high cholesterol or other risk factors for heart disease.  The blood test for cholesterol measures:  "Bad" cholesterol (LDL cholesterol). This is the main type of cholesterol that causes heart disease. The desired level for LDL is less than 100.  "Good" cholesterol (HDL cholesterol). This type helps to protect against heart disease by cleaning the arteries and carrying the LDL away. The desired level for HDL is 60 or higher.  Triglycerides. These are fats that the body can store or burn for energy. The desired number for triglycerides is lower than 150.  Total cholesterol. This is a measure of the total amount of cholesterol in your blood, including LDL  cholesterol, HDL cholesterol, and triglycerides. A healthy number is less than 200.  How is this treated? This condition is treated with diet changes, lifestyle changes, and medicines. Diet changes  This may include eating more whole grains, fruits, vegetables, nuts, and fish.  This may also include cutting back on red meat and foods that have a lot of added sugar. Lifestyle changes  Changes may include getting at least 40 minutes of aerobic exercise 3 times a week. Aerobic exercises include walking, biking, and swimming. Aerobic exercise along with a healthy diet can help you maintain a healthy weight.  Changes may also include quitting smoking. Medicines  Medicines are usually given if diet and lifestyle changes have failed to reduce your cholesterol to healthy levels.  Your health care provider may prescribe a statin medicine. Statin medicines have been shown to reduce cholesterol, which can reduce the risk of heart disease. Follow these instructions at home: Eating and drinking  If told by your health care provider:  Eat chicken (without skin), fish, veal, shellfish, ground turkey breast, and round or loin cuts of red meat.  Do not eat fried foods or fatty meats, such as hot dogs and salami.  Eat plenty of fruits, such as apples.  Eat plenty of vegetables, such as broccoli, potatoes, and carrots.  Eat beans, peas, and lentils.  Eat grains such as barley, rice, couscous, and bulgur wheat.  Eat pasta without cream sauces.  Use skim or nonfat milk, and eat low-fat or nonfat yogurt and cheeses.  Do not eat or drink whole milk, cream, ice   cream, egg yolks, or hard cheeses.  Do not eat stick margarine or tub margarines that contain trans fats (also called partially hydrogenated oils).  Do not eat saturated tropical oils, such as coconut oil and palm oil.  Do not eat cakes, cookies, crackers, or other baked goods that contain trans fats.  General instructions  Exercise  as directed by your health care provider. Increase your activity level with activities such as gardening, walking, and taking the stairs.  Take over-the-counter and prescription medicines only as told by your health care provider.  Do not use any products that contain nicotine or tobacco, such as cigarettes and e-cigarettes. If you need help quitting, ask your health care provider.  Keep all follow-up visits as told by your health care provider. This is important. Contact a health care provider if:  You are struggling to maintain a healthy diet or weight.  You need help to start on an exercise program.  You need help to stop smoking. Get help right away if:  You have chest pain.  You have trouble breathing. This information is not intended to replace advice given to you by your health care provider. Make sure you discuss any questions you have with your health care provider. Document Released: 05/03/2005 Document Revised: 11/29/2015 Document Reviewed: 11/01/2015 Elsevier Interactive Patient Education  2018 [employer]. Results for orders placed or performed in visit on 04/04/18  CBC with Differential  Result Value Ref Range   WBC 5.8 3.4 - 10.8 x10E3/uL   RBC 5.09 4.14 - 5.80 x10E6/uL   Hemoglobin 15.4 13.0 - 17.7 g/dL   Hematocrit 86.5 78.4 - 51.0 %   MCV 86 79 - 97 fL   MCH 30.3 26.6 - 33.0 pg   MCHC 35.0 31.5 - 35.7 g/dL   RDW 69.6 29.5 - 28.4 %   Platelets 141 (L) 150 - 450 x10E3/uL   Neutrophils 47 Not Estab. %   Lymphs 35 Not Estab. %   Monocytes 11 Not Estab. %   Eos 6 Not Estab. %   Basos 1 Not Estab. %   Neutrophils Absolute 2.8 1.4 - 7.0 x10E3/uL   Lymphocytes Absolute 2.1 0.7 - 3.1 x10E3/uL   Monocytes Absolute 0.6 0.1 - 0.9 x10E3/uL   EOS (ABSOLUTE) 0.4 0.0 - 0.4 x10E3/uL   Basophils Absolute 0.0 0.0 - 0.2 x10E3/uL   Immature Granulocytes 0 Not Estab. %   Immature Grans (Abs) 0.0 0.0 - 0.1 x10E3/uL  PSA  Result Value Ref Range   Prostate Specific Ag,  Serum 1.2 0.0 - 4.0 ng/mL  Basic Metabolic Panel (BMET)  Result Value Ref Range   Glucose 91 65 - 99 mg/dL   BUN 12 6 - 24 mg/dL   Creatinine, Ser 1.32 0.76 - 1.27 mg/dL   GFR calc non Af Amer 84 >59 mL/min/1.73   GFR calc Af Amer 97 >59 mL/min/1.73   BUN/Creatinine Ratio 11 9 - 20   Sodium 139 134 - 144 mmol/L   Potassium 4.5 3.5 - 5.2 mmol/L   Chloride 103 96 - 106 mmol/L   CO2 23 20 - 29 mmol/L   Calcium 9.2 8.7 - 10.2 mg/dL  Hepatic function panel  Result Value Ref Range   Total Protein 6.6 6.0 - 8.5 g/dL   Albumin 4.2 3.5 - 5.5 g/dL   Bilirubin Total 0.3 0.0 - 1.2 mg/dL   Bilirubin, Direct 4.40 0.00 - 0.40 mg/dL   Alkaline Phosphatase 69 39 - 117 IU/L   AST 22 0 -  40 IU/L   ALT 21 0 - 44 IU/L  Lipid Profile  Result Value Ref Range   Cholesterol, Total 218 (H) 100 - 199 mg/dL   Triglycerides 67 0 - 149 mg/dL   HDL 56 >65>39 mg/dL   VLDL Cholesterol Cal 13 5 - 40 mg/dL   LDL Calculated 784149 (H) 0 - 99 mg/dL   Chol/HDL Ratio 3.9 0.0 - 5.0 ratio

## 2018-11-30 ENCOUNTER — Telehealth: Payer: Self-pay | Admitting: Family Medicine

## 2018-11-30 MED ORDER — PREDNISONE 20 MG PO TABS: ORAL_TABLET | ORAL | 0 refills | Status: DC

## 2018-11-30 NOTE — Telephone Encounter (Signed)
Pt with bad poison ivy on leg, x3 days worsening, using cream at home with minimal relief  Pt's working nights on a bike on [place]  Would like prednisone. (wife states in the past Hoyle Sauer has ordered this for him)  Please advise & call pt when done      [facility]/[place]

## 2018-11-30 NOTE — Telephone Encounter (Signed)
Adult ppred taper

## 2018-11-30 NOTE — Telephone Encounter (Signed)
Prescription sent electronically to pharmacy. Left message to return call to notify patient. 

## 2018-12-01 NOTE — Telephone Encounter (Signed)
Prescription picked up by patient yesterday per [place]

## 2019-02-09 ENCOUNTER — Other Ambulatory Visit: Payer: Self-pay

## 2019-02-09 DIAGNOSIS — Z20822 Contact with and (suspected) exposure to covid-19: Secondary | ICD-10-CM

## 2019-02-10 LAB — NOVEL CORONAVIRUS, NAA: SARS-CoV-2, NAA: NOT DETECTED

## 2019-02-12 ENCOUNTER — Telehealth: Payer: Self-pay | Admitting: General Practice

## 2019-02-12 NOTE — Telephone Encounter (Signed)
Negative COVID results given. Patient results "NOT Detected." Caller expressed understanding. ° °

## 2019-06-20 ENCOUNTER — Encounter: Payer: Self-pay | Admitting: Family Medicine

## 2020-11-11 ENCOUNTER — Telehealth: Payer: Self-pay | Admitting: Family Medicine

## 2020-11-11 NOTE — Telephone Encounter (Signed)
Please advise. Thank you

## 2020-11-11 NOTE — Telephone Encounter (Signed)
Patient's spouse Almira Coaster called to request refill of cream for poison ivy (has a history); trying to prevent issue from worsening to require prednisone; requested that message be routed to NP [facility] directly. Patient scheduled for an appt on Thursday. Please advise at [phone], or call spouse Almira Coaster at [phone].

## 2020-11-13 ENCOUNTER — Encounter: Payer: Self-pay | Admitting: Nurse Practitioner

## 2020-11-13 ENCOUNTER — Other Ambulatory Visit: Payer: Self-pay

## 2020-11-13 ENCOUNTER — Ambulatory Visit: Payer: 59 | Admitting: Nurse Practitioner

## 2020-11-13 VITALS — BP 122/82 | Temp 97.2°F | Ht 71.0 in | Wt 201.4 lb

## 2020-11-13 DIAGNOSIS — L255 Unspecified contact dermatitis due to plants, except food: Secondary | ICD-10-CM | POA: Diagnosis not present

## 2020-11-13 MED ORDER — TRIAMCINOLONE ACETONIDE 0.1 % EX CREA: 1.0000 "application " | TOPICAL_CREAM | Freq: Two times a day (BID) | CUTANEOUS | 2 refills | Status: DC

## 2020-11-13 MED ORDER — PREDNISONE 20 MG PO TABS: ORAL_TABLET | ORAL | 0 refills | Status: DC

## 2020-11-13 NOTE — Progress Notes (Signed)
   Subjective:    Patient ID: James Underwood, male    DOB: 11/15/1970, 50 y.o.   MRN: [id number]  HPI  Patient requesting refill of cream for poison ivy.  Has had recurrent issues with this in the past, has seen to improve over time as far as his reaction.  No fevers.  No wheezing shortness of breath or difficulty swallowing.  Keeps his cream on hand in case he has a mild flareup.  Noticed this infection after working in the yard.       Objective:   Physical Exam NAD.  Alert, oriented.  Lungs clear.  Heart regular rate rhythm.  Multiple scattered papules with some excoriation in various stages of healing noted on both forearms and hands.  Today's Vitals   11/13/20 0915  BP: 122/82  Temp: (!) 97.2 F (36.2 C)  TempSrc: Oral  Weight: 201 lb 6.4 oz (91.4 kg)  Height: 5\' 11"  (1.803 m)   Body mass index is 28.09 kg/m.      Assessment & Plan:  Rhus dermatitis Meds ordered this encounter  Medications   triamcinolone cream (KENALOG) 0.1 %    Sig: Apply 1 application topically 2 (two) times daily.    Dispense:  30 g    Refill:  2    Order Specific Question:   Supervising Provider    Answer:   A [[id number]]   predniSONE (DELTASONE) 20 MG tablet    Sig: Take 3 tabs qd x 3 days, 2 tabs qd x 3 days and 1 tab qd x 3 days    Dispense:  18 tablet    Refill:  0    PLACE ON HOLD; HOLD UNLESS PATIENT CALLS    Order Specific Question:   Supervising Provider    Answer:   Lilyan Punt A [[id number]]   Continue triamcinolone cream as directed as needed.  Advised patient not to use more than 2 weeks at a time on any 1 area.  Patient also requested a prescription of prednisone to have on hold in case symptoms worsen. Recheck here if worsens or persists.

## 2020-11-13 NOTE — Telephone Encounter (Signed)
Patient seen in office on 11/13/20

## 2021-05-14 ENCOUNTER — Encounter (HOSPITAL_COMMUNITY): Payer: Self-pay | Admitting: Emergency Medicine

## 2021-05-14 ENCOUNTER — Emergency Department (HOSPITAL_COMMUNITY)
Admission: EM | Admit: 2021-05-14 | Discharge: 2021-05-14 | Disposition: A | Discharge status: Home / Self Care | Payer: 59 | Attending: Emergency Medicine | Admitting: Emergency Medicine

## 2021-05-14 ENCOUNTER — Other Ambulatory Visit: Payer: Self-pay

## 2021-05-14 DIAGNOSIS — J029 Acute pharyngitis, unspecified: Secondary | ICD-10-CM | POA: Insufficient documentation

## 2021-05-14 DIAGNOSIS — Z87891 Personal history of nicotine dependence: Secondary | ICD-10-CM | POA: Diagnosis not present

## 2021-05-14 DIAGNOSIS — H9203 Otalgia, bilateral: Secondary | ICD-10-CM | POA: Diagnosis not present

## 2021-05-14 DIAGNOSIS — R55 Syncope and collapse: Secondary | ICD-10-CM | POA: Diagnosis not present

## 2021-05-14 LAB — CBC WITH DIFFERENTIAL/PLATELET
Abs Immature Granulocytes: 0.03 10*3/uL (ref 0.00–0.07)
Basophils Absolute: 0 10*3/uL (ref 0.0–0.1)
Basophils Relative: 0 %
Eosinophils Absolute: 0 10*3/uL (ref 0.0–0.5)
Eosinophils Relative: 0 %
HCT: 46.9 % (ref 39.0–52.0)
Hemoglobin: 16.7 g/dL (ref 13.0–17.0)
Immature Granulocytes: 0 %
Lymphocytes Relative: 10 %
Lymphs Abs: 1.1 10*3/uL (ref 0.7–4.0)
MCH: 31.7 pg (ref 26.0–34.0)
MCHC: 35.6 g/dL (ref 30.0–36.0)
MCV: 89 fL (ref 80.0–100.0)
Monocytes Absolute: 1 10*3/uL (ref 0.1–1.0)
Monocytes Relative: 10 %
Neutro Abs: 8.3 10*3/uL — ABNORMAL HIGH (ref 1.7–7.7)
Neutrophils Relative %: 80 %
Platelets: 110 10*3/uL — ABNORMAL LOW (ref 150–400)
RBC: 5.27 MIL/uL (ref 4.22–5.81)
RDW: 12.1 % (ref 11.5–15.5)
WBC: 10.5 10*3/uL (ref 4.0–10.5)
nRBC: 0 % (ref 0.0–0.2)

## 2021-05-14 LAB — BASIC METABOLIC PANEL
Anion gap: 9 (ref 5–15)
BUN: 8 mg/dL (ref 6–20)
CO2: 24 mmol/L (ref 22–32)
Calcium: 8.7 mg/dL — ABNORMAL LOW (ref 8.9–10.3)
Chloride: 103 mmol/L (ref 98–111)
Creatinine, Ser: 1.01 mg/dL (ref 0.61–1.24)
GFR, Estimated: >60 mL/min (ref >60)
Glucose, Bld: 146 mg/dL — ABNORMAL HIGH (ref 70–99)
Potassium: 3.4 mmol/L — ABNORMAL LOW (ref 3.5–5.1)
Sodium: 136 mmol/L (ref 135–145)

## 2021-05-14 MED ORDER — KETOROLAC TROMETHAMINE 30 MG/ML IJ SOLN
30.0000 mg | Freq: Once | INTRAMUSCULAR | Status: AC
  Administered 2021-05-14: 05:00:00 30 mg via INTRAVENOUS
  Filled 2021-05-14: qty 1

## 2021-05-14 MED ORDER — HYDROCODONE-ACETAMINOPHEN 7.5-325 MG/15ML PO SOLN
10.0000 mL | Freq: Once | ORAL | Status: AC
  Administered 2021-05-14: 06:00:00 10 mL via ORAL
  Filled 2021-05-14: qty 15

## 2021-05-14 MED ORDER — LACTATED RINGERS IV BOLUS
2000.0000 mL | Freq: Once | INTRAVENOUS | Status: AC
  Administered 2021-05-14: 05:00:00 2000 mL via INTRAVENOUS

## 2021-05-14 MED ORDER — HYDROCODONE-ACETAMINOPHEN 7.5-325 MG/15ML PO SOLN
15.0000 mL | Freq: Two times a day (BID) | ORAL | 0 refills | Status: DC | PRN
Start: 2021-05-14 — End: 2021-12-03

## 2021-05-14 NOTE — ED Triage Notes (Signed)
Pt had near syncopal episode at home per wife. Pt c/o ear and throat pain. Pt diagnosed with covid x 2 days. Pt states he can hardly swallow.

## 2021-05-14 NOTE — ED Notes (Signed)
Pt ambulatory with no difficulty or distress noted. MD notified.

## 2021-05-14 NOTE — ED Provider Notes (Signed)
[facility] EMERGENCY DEPARTMENT Provider Note   CSN: [id number] Arrival date & time: 05/14/21  9562     History Chief Complaint  Patient presents with   Near Syncope    James Underwood is a 50 y.o. male.  The history is provided by the patient and the spouse.  Near Syncope This is a new problem. The current episode started less than 1 hour ago. The problem occurs constantly. The problem has not changed since onset.Pertinent negatives include no chest pain, no abdominal pain and no shortness of breath. The symptoms are aggravated by standing. The symptoms are relieved by rest.  Patient is otherwise healthy.  He was diagnosed at home with COVID approximately 2 days ago.  He reports sore throat and bilateral ear pain.  He has had very little oral intake Prior to arrival, patient was walking back from the bathroom when he felt very weak, appeared pale and was diaphoretic.  He had to lie down but not have full syncope. Patient reports it hurts to swallow and he feels very weak.     Past Medical History:  Diagnosis Date   Ankle fracture about 2007   right   Pancreatitis     Patient Active Problem List   Diagnosis Date Noted   Acute pancreatitis 02/06/2012   Hyponatremia 02/06/2012   Dehydration 02/06/2012    Past Surgical History:  Procedure Laterality Date   EUS  03/22/2012   Procedure: UPPER ENDOSCOPIC ULTRASOUND (EUS) RADIAL;  Surgeon: Willis Modena, MD;  Location: [facility] ENDOSCOPY;  Service: Endoscopy;  Laterality: N/A;  radial scope   None         Family History  Problem Relation Age of Onset   Colon cancer Neg Hx     Social History   Tobacco Use   Smoking status: Former   Smokeless tobacco: Former  Substance Use Topics   Alcohol use: No    Alcohol/week: 5.0 standard drinks    Types: 5 Standard drinks or equivalent per week   Drug use: No    Home Medications Prior to Admission medications   Medication Sig Start Date End Date Taking? Authorizing Provider   predniSONE (DELTASONE) 20 MG tablet Take 3 tabs qd x 3 days, 2 tabs qd x 3 days and 1 tab qd x 3 days 11/13/20   Campbell Riches, NP  triamcinolone cream (KENALOG) 0.1 % Apply 1 application topically 2 (two) times daily. 11/13/20   Campbell Riches, NP    Allergies    Poison ivy extract [poison ivy extract]  Review of Systems   Review of Systems  Constitutional:  Positive for appetite change, chills and fatigue.  HENT:  Positive for ear pain and sore throat. Negative for drooling, hearing loss and trouble swallowing.   Eyes:  Negative for visual disturbance.  Respiratory:  Positive for cough. Negative for shortness of breath.   Cardiovascular:  Positive for near-syncope. Negative for chest pain.  Gastrointestinal:  Negative for abdominal pain.  Neurological:  Positive for light-headedness.  All other systems reviewed and are negative.  Physical Exam Updated Vital Signs BP 121/81    Pulse 80    Temp 97.7 F (36.5 C) (Oral)    Resp 20    Ht 1.803 m (5\' 11" )    Wt 91.4 kg    SpO2 96%    BMI 28.10 kg/m   Physical Exam CONSTITUTIONAL: Well developed/well nourished HEAD: Normocephalic/atraumatic EYES: EOMI/PERRL ENMT: Mucous membranes moist, uvula midline without edema, no stridor, no  drooling, no dysphonia Bilateral TMs mildly obstructed by cerumen, otherwise intact There is no facial swelling NECK: supple no meningeal signs SPINE/BACK:entire spine nontender CV: S1/S2 noted, no murmurs/rubs/gallops noted LUNGS: Lungs are clear to auscultation bilaterally, no apparent distress ABDOMEN: soft, nontender, no rebound or guarding, bowel sounds noted throughout abdomen GU:no cva tenderness NEURO: Pt is awake/alert/appropriate, moves all extremitiesx4.  No facial droop.   EXTREMITIES: pulses normal/equal, full ROM SKIN: warm, color normal PSYCH: no abnormalities of mood noted, alert and oriented to situation  ED Results / Procedures / Treatments   Labs (all labs ordered are  listed, but only abnormal results are displayed) Labs Reviewed  BASIC METABOLIC PANEL - Abnormal; Notable for the following components:      Result Value   Potassium 3.4 (*)    Glucose, Bld 146 (*)    Calcium 8.7 (*)    All other components within normal limits  CBC WITH DIFFERENTIAL/PLATELET - Abnormal; Notable for the following components:   Platelets 110 (*)    Neutro Abs 8.3 (*)    All other components within normal limits    EKG EKG Interpretation  Date/Time:  Thursday May 14 2021 03:51:46 EST Ventricular Rate:  84 PR Interval:  159 QRS Duration: 97 QT Interval:  362 QTC Calculation: 428 R Axis:   47 Text Interpretation: Sinus rhythm ST elev, probable normal early repol pattern Confirmed by Ripley Fraise [phone]) on 05/14/2021 3:54:32 AM  Radiology No results found.  Procedures Procedures   Medications Ordered in ED Medications  lactated ringers bolus 2,000 mL (2,000 mLs Intravenous New Bag/Given 05/14/21 0441)  ketorolac (TORADOL) 30 MG/ML injection 30 mg (30 mg Intravenous Given 05/14/21 0442)  HYDROcodone-acetaminophen (HYCET) 7.5-325 mg/15 ml solution 10 mL (10 mLs Oral Given 05/14/21 [id number])    ED Course  I have reviewed the triage vital signs and the nursing notes.  Pertinent labs results that were available during my care of the patient were reviewed by me and considered in my medical decision making (see chart for details).    MDM Rules/Calculators/A&P                         This patient presents to the ED for concern of near syncope, this involves an extensive number of treatment options, and is a complaint that carries with it a high risk of complications and morbidity.  The differential diagnosis includes dehydration, cardiac dysrhythmia, orthostatic hypotension, vasovagal episode   Additional history obtained:  Additional history obtained from spouse   Lab Tests:  I Ordered, reviewed, and interpreted labs.  The pertinent results include:   no significant findings      Cardiac Monitoring:  The patient was maintained on a cardiac monitor.  I personally viewed and interpreted the cardiac monitored which showed an underlying rhythm of: sinus rhythm   Medicines ordered and prescription drug management:  I ordered medication including IV fluids and toradol  for pain  Reevaluation of the patient after these medicines showed that the patient improved I have reviewed the patients home medicines and have made adjustments as needed   Critical Interventions:  IV fluids    Reevaluation:  After the interventions noted above, I reevaluated the patient and found that they have :improved   Dispostion:  After consideration of the diagnostic results and the patients response to treatment feel that the patent would benefit from pt is stable for d/c home.    6:43 AM Patient with recent diagnosis  of COVID-19 reports having sore throat and otalgia.  He reports very little p.o. intake.  Earlier in the night he had a near syncopal episode.  EKG was unrevealing, and patient is otherwise healthy at baseline. Suspect this was vasovagal episode or potentially orthostatic episode at home (though vitals normal here).  He is now feeling improved.  He is able to take oral fluids.  Labs are reassuring Patient does still have pain with swallowing, will prescribe short course of hydrocodone elixir. Patient is safe for discharge home and outpatient management Pt is ambulatory in the ED  Final Clinical Impression(s) / ED Diagnoses Final diagnoses:  Near syncope  Pharyngitis, unspecified etiology  Otalgia of both ears    Rx / DC Orders ED Discharge Orders          Ordered    HYDROcodone-acetaminophen (HYCET) 7.5-325 mg/15 ml solution  2 times daily PRN        05/14/21 0648             Ripley Fraise, MD 05/14/21 [phone]

## 2021-12-03 ENCOUNTER — Encounter: Payer: Self-pay | Admitting: Family Medicine

## 2021-12-03 ENCOUNTER — Ambulatory Visit (INDEPENDENT_AMBULATORY_CARE_PROVIDER_SITE_OTHER): Payer: 59 | Admitting: Family Medicine

## 2021-12-03 ENCOUNTER — Encounter (INDEPENDENT_AMBULATORY_CARE_PROVIDER_SITE_OTHER): Payer: Self-pay | Admitting: *Deleted

## 2021-12-03 VITALS — BP 132/84 | HR 64 | Temp 98.4°F | Ht 69.25 in | Wt 199.4 lb

## 2021-12-03 DIAGNOSIS — Z130 Encounter for screening for diseases of the blood and blood-forming organs and certain disorders involving the immune mechanism: Secondary | ICD-10-CM | POA: Diagnosis not present

## 2021-12-03 DIAGNOSIS — R030 Elevated blood-pressure reading, without diagnosis of hypertension: Secondary | ICD-10-CM | POA: Insufficient documentation

## 2021-12-03 DIAGNOSIS — Z91018 Allergy to other foods: Secondary | ICD-10-CM

## 2021-12-03 DIAGNOSIS — Z125 Encounter for screening for malignant neoplasm of prostate: Secondary | ICD-10-CM

## 2021-12-03 DIAGNOSIS — Z0000 Encounter for general adult medical examination without abnormal findings: Secondary | ICD-10-CM | POA: Diagnosis not present

## 2021-12-03 DIAGNOSIS — R7309 Other abnormal glucose: Secondary | ICD-10-CM | POA: Diagnosis not present

## 2021-12-03 DIAGNOSIS — E785 Hyperlipidemia, unspecified: Secondary | ICD-10-CM

## 2021-12-03 DIAGNOSIS — Z1211 Encounter for screening for malignant neoplasm of colon: Secondary | ICD-10-CM

## 2021-12-03 NOTE — Assessment & Plan Note (Deleted)
Improved on recheck.  Advised to check blood pressure regularly at home.

## 2021-12-03 NOTE — Assessment & Plan Note (Signed)
Lipid panel today

## 2021-12-03 NOTE — Assessment & Plan Note (Signed)
Advised monitor blood pressure at home. Labs today including alpha gal. Declines HIV and hepatitis C screening.  Declines COVID-19 vaccination.  Referral placed to GI for colonoscopy. Discussed shingles vaccination.

## 2021-12-03 NOTE — Patient Instructions (Signed)
Referral placed.  Labs today.  Watch BP (check at home).  Follow up in 6 months to 1 year.  Take care  Dr. Adriana Simas

## 2021-12-03 NOTE — Progress Notes (Signed)
Subjective:  Patient ID: James Underwood, male    DOB: [birth year]  Age: 51 y.o. MRN: [id number]  CC: Chief Complaint  Patient presents with   Establish Care    Pt requesting alpha gal testing-wife and daughter both have been blood tested.     HPI:  51 year old male with a history of hyperlipidemia and suspected alpha gal presents to establish care.  Patient states that he is feeling well.  Blood pressure was initially elevated and was slightly elevated on repeat.  Patient reports that he had a previous suspected reaction to meat.  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  His wife and daughter suffer from this.  He previously had hives and associated nausea after eating meat.  He would like alpha-gal testing today.  Patient has not been seen recently.  Needs labs.  In regards to his preventative health care, he declines hepatitis C and HIV screening.  Declines COVID-19 vaccination.  He is in need of colonoscopy.  He is amenable to referral.  We discussed shingles vaccine.  He will get this at his pharmacy if he desires.  Social Hx   Social History   Socioeconomic History   Marital status: Married    Spouse name: Not on file   Number of children: Not on file   Years of education: Not on file   Highest education level: Not on file  Occupational History   Occupation: Chemical engineer: UNEMPLOYED  Tobacco Use   Smoking status: Former   Smokeless tobacco: Former  Substance and Sexual Activity   Alcohol use: No    Alcohol/week: 5.0 standard drinks of alcohol    Types: 5 Standard drinks or equivalent per week   Drug use: No   Sexual activity: Not on file  Other Topics Concern   Not on file  Social History Narrative   Not on file   Social Determinants of Health   Financial Resource Strain: Not on file  Food Insecurity: Not on file  Transportation Needs: Not on file  Physical Activity: Not on file  Stress: Not on file  Social Connections: Not on file    Review of  Systems Per HPI  Objective:  BP 132/84   Pulse 64   Temp 98.4 F (36.9 C)   Ht 5' 9.25" (1.759 m)   Wt 199 lb 6.4 oz (90.4 kg)   SpO2 98%   BMI 29.23 kg/m      12/03/2021   10:18 AM 12/03/2021    9:42 AM 05/14/2021    7:30 AM  BP/Weight  Systolic BP [phone]  Diastolic BP 84 96 87  Wt. (Lbs)  199.4   BMI  29.23 kg/m2     Physical Exam Vitals and nursing note reviewed.  Constitutional:      General: He is not in acute distress.    Appearance: Normal appearance.  HENT:     Head: Normocephalic.  Eyes:     General:        Right eye: No discharge.        Left eye: No discharge.     Conjunctiva/sclera: Conjunctivae normal.  Cardiovascular:     Rate and Rhythm: Normal rate and regular rhythm.  Pulmonary:     Effort: Pulmonary effort is normal.     Breath sounds: Normal breath sounds. No wheezing, rhonchi or rales.  Neurological:     Mental Status: He is alert.  Psychiatric:  Mood and Affect: Mood normal.        Behavior: Behavior normal.     Lab Results  Component Value Date   WBC 10.5 05/14/2021   HGB 16.7 05/14/2021   HCT 46.9 05/14/2021   PLT 110 (L) 05/14/2021   GLUCOSE 146 (H) 05/14/2021   CHOL 218 (H) 04/24/2018   TRIG 67 04/24/2018   HDL 56 04/24/2018   LDLCALC 149 (H) 04/24/2018   ALT 21 04/24/2018   AST 22 04/24/2018   NA 136 05/14/2021   K 3.4 (L) 05/14/2021   CL 103 05/14/2021   CREATININE 1.01 05/14/2021   BUN 8 05/14/2021   CO2 24 05/14/2021   TSH 0.579 02/06/2012   HGBA1C 5.8 (H) 02/07/2012     Assessment & Plan:   Problem List Items Addressed This Visit       Other   Allergy to meat    Alpha gal panel ordered.      Relevant Orders   Alpha-Gal Panel   Annual physical exam - Primary    Advised monitor blood pressure at home. Labs today including alpha gal. Declines HIV and hepatitis C screening.  Declines COVID-19 vaccination.  Referral placed to GI for colonoscopy. Discussed shingles vaccination.      Elevated  BP without diagnosis of hypertension   Relevant Orders   CMP14+EGFR   Hyperlipidemia    Lipid panel today.      Relevant Orders   Lipid panel   Other Visit Diagnoses     Screening for deficiency anemia       Relevant Orders   CBC   Elevated glucose       Relevant Orders   Hemoglobin A1c   Prostate cancer screening       Relevant Orders   PSA   Colon cancer screening       Relevant Orders   Ambulatory referral to Gastroenterology      Follow-up: 6 months to 1 year.  [place]

## 2021-12-03 NOTE — Assessment & Plan Note (Signed)
Alpha gal panel ordered.

## 2021-12-04 LAB — CMP14+EGFR
ALT: 32 IU/L (ref 0–44)
AST: 24 IU/L (ref 0–40)
Albumin/Globulin Ratio: 1.8 (ref 1.2–2.2)
Albumin: 4.6 g/dL (ref 3.8–4.9)
Alkaline Phosphatase: 72 IU/L (ref 44–121)
BUN/Creatinine Ratio: 13 (ref 9–20)
BUN: 13 mg/dL (ref 6–24)
Bilirubin Total: 0.4 mg/dL (ref 0.0–1.2)
CO2: 21 mmol/L (ref 20–29)
Calcium: 9.5 mg/dL (ref 8.7–10.2)
Chloride: 103 mmol/L (ref 96–106)
Creatinine, Ser: 0.97 mg/dL (ref 0.76–1.27)
Globulin, Total: 2.5 g/dL (ref 1.5–4.5)
Glucose: 93 mg/dL (ref 70–99)
Potassium: 4.8 mmol/L (ref 3.5–5.2)
Sodium: 140 mmol/L (ref 134–144)
Total Protein: 7.1 g/dL (ref 6.0–8.5)
eGFR: 94 mL/min/{1.73_m2} (ref >59)

## 2021-12-04 LAB — CBC
Hematocrit: 44.5 % (ref 37.5–51.0)
Hemoglobin: 15.5 g/dL (ref 13.0–17.7)
MCH: 30.3 pg (ref 26.6–33.0)
MCHC: 34.8 g/dL (ref 31.5–35.7)
MCV: 87 fL (ref 79–97)
Platelets: 125 10*3/uL — ABNORMAL LOW (ref 150–450)
RBC: 5.12 x10E6/uL (ref 4.14–5.80)
RDW: 11.8 % (ref 11.6–15.4)
WBC: 5.1 10*3/uL (ref 3.4–10.8)

## 2021-12-04 LAB — PSA: Prostate Specific Ag, Serum: 1.3 ng/mL (ref 0.0–4.0)

## 2021-12-04 LAB — LIPID PANEL
Chol/HDL Ratio: 4.2 ratio (ref 0.0–5.0)
Cholesterol, Total: 240 mg/dL — ABNORMAL HIGH (ref 100–199)
HDL: 57 mg/dL (ref >39)
LDL Chol Calc (NIH): 169 mg/dL — ABNORMAL HIGH (ref 0–99)
Triglycerides: 79 mg/dL (ref 0–149)
VLDL Cholesterol Cal: 14 mg/dL (ref 5–40)

## 2021-12-04 LAB — HEMOGLOBIN A1C
Est. average glucose Bld gHb Est-mCnc: 114 mg/dL
Hgb A1c MFr Bld: 5.6 % (ref 4.8–5.6)

## 2021-12-06 ENCOUNTER — Other Ambulatory Visit: Payer: Self-pay | Admitting: Family Medicine

## 2021-12-06 MED ORDER — ROSUVASTATIN CALCIUM 10 MG PO TABS: 10.0000 mg | ORAL_TABLET | Freq: Every day | ORAL | 3 refills | Status: DC

## 2021-12-11 ENCOUNTER — Other Ambulatory Visit: Payer: Self-pay | Admitting: *Deleted

## 2021-12-11 ENCOUNTER — Telehealth: Payer: Self-pay | Admitting: *Deleted

## 2021-12-11 DIAGNOSIS — Z91018 Allergy to other foods: Secondary | ICD-10-CM

## 2021-12-11 NOTE — Telephone Encounter (Signed)
Patient calling to get alpha-gal results

## 2021-12-11 NOTE — Telephone Encounter (Signed)
Lab ordered and placed in mail

## 2021-12-11 NOTE — Telephone Encounter (Signed)
[employer] and spoke to [place]. She said with the labs that came over, the alpha gal was not on the order. Informed pt and told him what was going on. He is willing to have the test and would like the order to be mailed to him.

## 2021-12-23 ENCOUNTER — Encounter (INDEPENDENT_AMBULATORY_CARE_PROVIDER_SITE_OTHER): Payer: Self-pay

## 2021-12-23 ENCOUNTER — Telehealth (INDEPENDENT_AMBULATORY_CARE_PROVIDER_SITE_OTHER): Payer: Self-pay

## 2021-12-23 ENCOUNTER — Other Ambulatory Visit (INDEPENDENT_AMBULATORY_CARE_PROVIDER_SITE_OTHER): Payer: Self-pay

## 2021-12-23 DIAGNOSIS — Z1211 Encounter for screening for malignant neoplasm of colon: Secondary | ICD-10-CM

## 2021-12-23 MED ORDER — PEG 3350-KCL-NA BICARB-NACL 420 G PO SOLR: 4000.0000 mL | ORAL | 0 refills | Status: DC

## 2021-12-23 NOTE — Telephone Encounter (Signed)
Referring MD/PCP: Dr Everlene Other   Procedure: Tcs  Reason/Indication:  Screening Colonoscopy  Has patient had this procedure before?  No   If so, when, by whom and where?    Is there a family history of colon cancer?  Yes   Who?  What age when diagnosed?    Is patient diabetic? If yes, Type 1 or Type 2   no      Does patient have prosthetic heart valve or mechanical valve?  no  Do you have a pacemaker/defibrillator?  no  Has patient ever had endocarditis/atrial fibrillation? no  Does patient use oxygen? no  Has patient had joint replacement within last 12 months?  no  Is patient constipated or do they take laxatives? no  Does patient have a history of alcohol/drug use?  no  Have you had a stroke/heart attack last 6 mths? no  Do you take medicine for weight loss?  no  For male patients,: have you had a hysterectomy n/a                      are you post menopausal n/a                      do you still have your menstrual cycle n/a  Is patient on blood thinner such as Coumadin, Plavix and/or Aspirin? No   Medications: none  Allergies: NKDA  Medication Adjustment per Dr Alexander Mt  Procedure date & time: 01/20/22 at 1:50

## 2021-12-23 NOTE — Telephone Encounter (Signed)
Dupree Givler Ann Binta Statzer, CMA  ?

## 2021-12-24 LAB — ALPHA-GAL PANEL
Allergen Lamb IgE: 0.84 kU/L — AB
Beef IgE: 2.07 kU/L — AB
IgE (Immunoglobulin E), Serum: 187 IU/mL (ref 6–495)
O215-IgE Alpha-Gal: 3.77 kU/L — AB
Pork IgE: 1.09 kU/L — AB

## 2021-12-28 ENCOUNTER — Encounter: Payer: Self-pay | Admitting: *Deleted

## 2022-01-20 ENCOUNTER — Ambulatory Visit (HOSPITAL_BASED_OUTPATIENT_CLINIC_OR_DEPARTMENT_OTHER): Payer: 59 | Admitting: Anesthesiology

## 2022-01-20 ENCOUNTER — Encounter (HOSPITAL_COMMUNITY): Payer: Self-pay | Admitting: Gastroenterology

## 2022-01-20 ENCOUNTER — Ambulatory Visit (HOSPITAL_COMMUNITY)
Admission: RE | Admit: 2022-01-20 | Discharge: 2022-01-20 | Disposition: A | Discharge status: Home / Self Care | Payer: 59 | Source: Ambulatory Visit | Attending: Gastroenterology | Admitting: Gastroenterology

## 2022-01-20 ENCOUNTER — Encounter (INDEPENDENT_AMBULATORY_CARE_PROVIDER_SITE_OTHER): Payer: Self-pay | Admitting: *Deleted

## 2022-01-20 ENCOUNTER — Other Ambulatory Visit: Payer: Self-pay

## 2022-01-20 ENCOUNTER — Ambulatory Visit (HOSPITAL_COMMUNITY): Payer: 59 | Admitting: Anesthesiology

## 2022-01-20 ENCOUNTER — Encounter (HOSPITAL_COMMUNITY)
Admission: RE | Disposition: A | Discharge status: Home / Self Care | Payer: Self-pay | Source: Ambulatory Visit | Attending: Gastroenterology

## 2022-01-20 DIAGNOSIS — K5730 Diverticulosis of large intestine without perforation or abscess without bleeding: Secondary | ICD-10-CM | POA: Insufficient documentation

## 2022-01-20 DIAGNOSIS — Z87891 Personal history of nicotine dependence: Secondary | ICD-10-CM | POA: Insufficient documentation

## 2022-01-20 DIAGNOSIS — Z1211 Encounter for screening for malignant neoplasm of colon: Secondary | ICD-10-CM

## 2022-01-20 HISTORY — PX: COLONOSCOPY WITH PROPOFOL: SHX5780

## 2022-01-20 LAB — HM COLONOSCOPY

## 2022-01-20 SURGERY — COLONOSCOPY WITH PROPOFOL
Anesthesia: General

## 2022-01-20 MED ORDER — PROPOFOL 10 MG/ML IV BOLUS
INTRAVENOUS | Status: DC | PRN
  Administered 2022-01-20: 100 mg via INTRAVENOUS

## 2022-01-20 MED ORDER — LACTATED RINGERS IV SOLN: INTRAVENOUS | Status: DC

## 2022-01-20 MED ORDER — STERILE WATER FOR IRRIGATION IR SOLN
Status: DC | PRN
  Administered 2022-01-20: 50 mL

## 2022-01-20 MED ORDER — PROPOFOL 500 MG/50ML IV EMUL
INTRAVENOUS | Status: DC | PRN
  Administered 2022-01-20: 150 ug/kg/min via INTRAVENOUS

## 2022-01-20 NOTE — Anesthesia Postprocedure Evaluation (Signed)
Anesthesia Post Note  Patient: James Underwood  Procedure(s) Performed: COLONOSCOPY WITH PROPOFOL  Patient location during evaluation: Phase II Anesthesia Type: General Level of consciousness: awake and alert and oriented Pain management: pain level controlled Vital Signs Assessment: post-procedure vital signs reviewed and stable Respiratory status: spontaneous breathing, nonlabored ventilation and respiratory function stable Cardiovascular status: blood pressure returned to baseline and stable Postop Assessment: no apparent nausea or vomiting Anesthetic complications: no   No notable events documented.   Last Vitals:  Vitals:   01/20/22 0803 01/20/22 0807  BP: (!) 99/58 (!) 101/57  Pulse: 69   Resp: 13   Temp: 36.6 C   SpO2: 97%     Last Pain:  Vitals:   01/20/22 0804  TempSrc:   PainSc: 0-No pain                 Marcellene Shivley C Dacia Capers

## 2022-01-20 NOTE — H&P (Signed)
James Underwood is an 51 y.o. male.   Chief Complaint: CRC screening HPI: 51 y/o M with no PMH, coming for screening colonoscopy. The patient has never had a colonoscopy in the past.  The patient denies having any complaints such as melena, hematochezia, abdominal pain or distention, change in her bowel movement consistency or frequency, no changes in weight recently.  Father was just diagnosed with colon cancer.   Past Medical History:  Diagnosis Date   Ankle fracture about 2007   right   Pancreatitis     Past Surgical History:  Procedure Laterality Date   EUS  03/22/2012   Procedure: UPPER ENDOSCOPIC ULTRASOUND (EUS) RADIAL;  Surgeon: Willis Modena, MD;  Location: [facility];  Service: Endoscopy;  Laterality: N/A;  radial scope   None      Family History  Problem Relation Age of Onset   Colon cancer Neg Hx    Social History:  reports that he has quit smoking. He has quit using smokeless tobacco. He reports that he does not drink alcohol and does not use drugs.  Allergies:  Allergies  Allergen Reactions   Other     Alpha-Gal    Poison Ivy Extract [Poison Ivy Extract] Rash    Medications Prior to Admission  Medication Sig Dispense Refill   polyethylene glycol-electrolytes (TRILYTE) 420 g solution Take 4,000 mLs by mouth as directed. 4000 mL 0   rosuvastatin (CRESTOR) 10 MG tablet Take 1 tablet (10 mg total) by mouth daily. (Patient not taking: Reported on 01/13/2022) 90 tablet 3    No results found for this or any previous visit (from the past 48 hour(s)). No results found.  Review of Systems  All other systems reviewed and are negative.   Blood pressure (!) 146/89, temperature 97.7 F (36.5 C), temperature source Oral, height 5\' 11"  (1.803 m), weight 88.5 kg, SpO2 98 %. Physical Exam  GENERAL: The patient is AO x3, in no acute distress. HEENT: Head is normocephalic and atraumatic. EOMI are intact. Mouth is well hydrated and without lesions. NECK: Supple. No  masses LUNGS: Clear to auscultation. No presence of rhonchi/wheezing/rales. Adequate chest expansion HEART: RRR, normal s1 and s2. ABDOMEN: Soft, nontender, no guarding, no peritoneal signs, and nondistended. BS +. No masses. EXTREMITIES: Without any cyanosis, clubbing, rash, lesions or edema. NEUROLOGIC: AOx3, no focal motor deficit. SKIN: no jaundice, no rashes  Assessment/Plan 51 y/o M with no PMH, coming for screening colonoscopy. The patient has never had a colonoscopy in the past. The patient is at average risk for colorectal cancer.  We will proceed with colonoscopy today.   44, MD 01/20/2022, 7:34 AM

## 2022-01-20 NOTE — Transfer of Care (Signed)
Immediate Anesthesia Transfer of Care Note  Patient: KEIFFER PIPER  Procedure(s) Performed: COLONOSCOPY WITH PROPOFOL  Patient Location: Endoscopy Unit  Anesthesia Type:General  Level of Consciousness: awake  Airway & Oxygen Therapy: Patient Spontanous Breathing  Post-op Assessment: Report given to RN and Post -op Vital signs reviewed and stable  Post vital signs: Reviewed and stable  Last Vitals:  Vitals Value Taken Time  BP 99/58 01/20/22 0803  Temp 36.6 C 01/20/22 0803  Pulse 69 01/20/22 0803  Resp 13 01/20/22 0803  SpO2 97 % 01/20/22 0803    Last Pain:  Vitals:   01/20/22 0803  TempSrc: Oral  PainSc:       Patients Stated Pain Goal: 8 (01/20/22 0646)  Complications: No notable events documented.

## 2022-01-20 NOTE — Anesthesia Preprocedure Evaluation (Addendum)
Anesthesia Evaluation  Patient identified by MRN, date of birth, ID band Patient awake    Reviewed: Allergy & Precautions, NPO status , Patient's Chart, lab work & pertinent test results  Airway Mallampati: II  TM Distance: >3 FB Neck ROM: Full    Dental  (+) Dental Advisory Given, Teeth Intact   Pulmonary former smoker,    Pulmonary exam normal breath sounds clear to auscultation       Cardiovascular negative cardio ROS Normal cardiovascular exam Rhythm:Regular Rate:Normal     Neuro/Psych negative neurological ROS  negative psych ROS   GI/Hepatic negative GI ROS, Neg liver ROS,   Endo/Other  negative endocrine ROS  Renal/GU negative Renal ROS  negative genitourinary   Musculoskeletal negative musculoskeletal ROS (+)   Abdominal   Peds negative pediatric ROS (+)  Hematology negative hematology ROS (+)   Anesthesia Other Findings   Reproductive/Obstetrics negative OB ROS                            Anesthesia Physical Anesthesia Plan  ASA: 1  Anesthesia Plan: General   Post-op Pain Management: Minimal or no pain anticipated   Induction: Intravenous  PONV Risk Score and Plan: Propofol infusion  Airway Management Planned: Nasal Cannula and Natural Airway  Additional Equipment:   Intra-op Plan:   Post-operative Plan:   Informed Consent: I have reviewed the patients History and Physical, chart, labs and discussed the procedure including the risks, benefits and alternatives for the proposed anesthesia with the patient or authorized representative who has indicated his/her understanding and acceptance.     Dental advisory given  Plan Discussed with: CRNA and Surgeon  Anesthesia Plan Comments:        Anesthesia Quick Evaluation

## 2022-01-20 NOTE — Discharge Instructions (Signed)
You are being discharged to home.  Resume your previous diet.  Your physician has recommended a repeat colonoscopy in 10 years for screening purposes.  

## 2022-01-20 NOTE — Op Note (Signed)
[facility] Patient Name: James Underwood Procedure Date: 01/20/2022 7:15 AM MRN: [id number] Date of Birth: 31-Mar-1971 Attending MD: Katrinka Blazing ,  CSN: [id number] Age: 51 Admit Type: Outpatient Procedure:                Colonoscopy Indications:              Screening for colorectal malignant neoplasm Providers:                Katrinka Blazing, Buel Ream. Museum/gallery exhibitions officer, Charity fundraiser,                            Judeth Cornfield. Jessee Avers, Technician Referring MD:              Medicines:                Monitored Anesthesia Care Complications:            No immediate complications. Estimated Blood Loss:     Estimated blood loss: none. Procedure:                Pre-Anesthesia Assessment:                           - Prior to the procedure, a History and Physical                            was performed, and patient medications, allergies                            and sensitivities were reviewed. The patient's                            tolerance of previous anesthesia was reviewed.                           - The risks and benefits of the procedure and the                            sedation options and risks were discussed with the                            patient. All questions were answered and informed                            consent was obtained.                           - ASA Grade Assessment: I - A normal, healthy                            patient.                           After obtaining informed consent, the colonoscope                            was passed under direct vision. Throughout the  procedure, the patient's blood pressure, pulse, and                            oxygen saturations were monitored continuously. The                            [id number]-HQ190L ([id number]) scope was introduced through                            the anus and advanced to the the cecum, identified                            by appendiceal orifice and ileocecal valve. The                             colonoscopy was performed without difficulty. The                            patient tolerated the procedure well. The quality                            of the bowel preparation was excellent. Scope In: 7:43:02 AM Scope Out: 8:00:24 AM Scope Withdrawal Time: 0 hours 14 minutes 6 seconds  Total Procedure Duration: 0 hours 17 minutes 22 seconds  Findings:      The perianal and digital rectal examinations were normal.      A few small-mouthed diverticula were found in the sigmoid colon.      The retroflexed view of the distal rectum and anal verge was normal and       showed no anal or rectal abnormalities. Impression:               - Diverticulosis in the sigmoid colon.                           - The distal rectum and anal verge are normal on                            retroflexion view.                           - No specimens collected. Moderate Sedation:      Per Anesthesia Care Recommendation:           - Discharge patient to home (ambulatory).                           - Resume previous diet.                           - Repeat colonoscopy in 10 years for screening                            purposes. Procedure Code(s):        --- Professional ---  F6213, Colorectal cancer screening; colonoscopy on                            individual not meeting criteria for high risk Diagnosis Code(s):        --- Professional ---                           Z12.11, Encounter for screening for malignant                            neoplasm of colon                           K57.30, Diverticulosis of large intestine without                            perforation or abscess without bleeding CPT copyright 2019 American Medical Association. All rights reserved. The codes documented in this report are preliminary and upon coder review may  be revised to meet current compliance requirements. Katrinka Blazing, MD Katrinka Blazing,  01/20/2022 8:06:33 AM This  report has been signed electronically. Number of Addenda: 0

## 2022-01-28 ENCOUNTER — Encounter (HOSPITAL_COMMUNITY): Payer: Self-pay | Admitting: Gastroenterology

## 2022-12-09 ENCOUNTER — Other Ambulatory Visit (HOSPITAL_COMMUNITY): Payer: Self-pay

## 2022-12-09 ENCOUNTER — Ambulatory Visit (INDEPENDENT_AMBULATORY_CARE_PROVIDER_SITE_OTHER): Payer: 59

## 2022-12-09 ENCOUNTER — Ambulatory Visit
Admission: EM | Admit: 2022-12-09 | Discharge: 2022-12-09 | Disposition: A | Discharge status: Home / Self Care | Payer: 59 | Attending: Internal Medicine | Admitting: Internal Medicine

## 2022-12-09 DIAGNOSIS — R059 Cough, unspecified: Secondary | ICD-10-CM | POA: Diagnosis not present

## 2022-12-09 DIAGNOSIS — J209 Acute bronchitis, unspecified: Secondary | ICD-10-CM

## 2022-12-09 DIAGNOSIS — R509 Fever, unspecified: Secondary | ICD-10-CM | POA: Diagnosis not present

## 2022-12-09 MED ORDER — AZITHROMYCIN 250 MG PO TABS
ORAL_TABLET | ORAL | 0 refills | Status: DC
  Filled 2022-12-09: qty 6, 5d supply, fill #0

## 2022-12-09 MED ORDER — ALBUTEROL SULFATE (2.5 MG/3ML) 0.083% IN NEBU
2.5000 mg | INHALATION_SOLUTION | Freq: Once | RESPIRATORY_TRACT | Status: AC
  Administered 2022-12-09: 2.5 mg via RESPIRATORY_TRACT

## 2022-12-09 MED ORDER — ALBUTEROL SULFATE HFA 108 (90 BASE) MCG/ACT IN AERS
2.0000 | INHALATION_SPRAY | RESPIRATORY_TRACT | 0 refills | Status: AC | PRN
  Filled 2022-12-09: qty 6.7, 25d supply, fill #0

## 2022-12-09 MED ORDER — HYDROCODONE BIT-HOMATROP MBR 5-1.5 MG/5ML PO SOLN: 5.0000 mL | Freq: Every evening | ORAL | 0 refills | Status: DC | PRN

## 2022-12-09 MED ORDER — HYDROCODONE BIT-HOMATROP MBR 5-1.5 MG/5ML PO SOLN
5.0000 mL | Freq: Every evening | ORAL | 0 refills | Status: DC | PRN
  Filled 2022-12-09: qty 120, 24d supply, fill #0

## 2022-12-09 NOTE — ED Provider Notes (Signed)
[facility]    CSN: [id number] Arrival date & time: 12/09/22  1128      History   Chief Complaint No chief complaint on file.   HPI James Underwood is a 52 y.o. male who presents with onset of ST, sinus drainage which started one week ago. Then in the past 2 days he has developed a wheezy cough and fever, but the temp was not measured, and has been very fatigued. The ST resolved. Has not tested himself for Covid. His son was sick with ST and nose congestion and mild cough and he tested negative for Covid.     Past Medical History:  Diagnosis Date   Ankle fracture about 2007   right   Pancreatitis     Patient Active Problem List   Diagnosis Date Noted   Allergy to meat 12/03/2021   Hyperlipidemia 12/03/2021   Elevated BP without diagnosis of hypertension 12/03/2021   Annual physical exam 12/03/2021    Past Surgical History:  Procedure Laterality Date   COLONOSCOPY WITH PROPOFOL N/A 01/20/2022   Procedure: COLONOSCOPY WITH PROPOFOL;  Surgeon: Dolores Frame, MD;  Location: [facility];  Service: Gastroenterology;  Laterality: N/A;  150 ASA 1   EUS  03/22/2012   Procedure: UPPER ENDOSCOPIC ULTRASOUND (EUS) RADIAL;  Surgeon: Willis Modena, MD;  Location: [facility];  Service: Endoscopy;  Laterality: N/A;  radial scope   None         Home Medications    Prior to Admission medications   Medication Sig Start Date End Date Taking? Authorizing Provider  albuterol (VENTOLIN HFA) 108 (90 Base) MCG/ACT inhaler Inhale 2 puffs into the lungs every 4 (four) hours as needed for wheezing or shortness of breath. 12/09/22  Yes Rodriguez-Southworth, Nettie Elm, PA-C  azithromycin (ZITHROMAX) 250 MG tablet Take first 2 tablets together, then 1 every day until finished. 12/09/22  Yes Rodriguez-Southworth, Nettie Elm, PA-C  HYDROcodone bit-homatropine (HYCODAN) 5-1.5 MG/5ML syrup Take 5 mLs by mouth at bedtime as needed for cough. 12/09/22  Yes Rodriguez-Southworth, Nettie Elm,  PA-C    Family History Family History  Problem Relation Age of Onset   Colon cancer Neg Hx     Social History Social History   Tobacco Use   Smoking status: Former   Smokeless tobacco: Former  Substance Use Topics   Alcohol use: No    Alcohol/week: 5.0 standard drinks of alcohol    Types: 5 Standard drinks or equivalent per week   Drug use: No     Allergies   Other and Poison ivy extract [poison ivy extract]   Review of Systems Review of Systems As noted in HPI  Physical Exam Triage Vital Signs ED Triage Vitals  Encounter Vitals Group     BP 12/09/22 1132 135/85     Systolic BP Percentile --      Diastolic BP Percentile --      Pulse Rate 12/09/22 1132 85     Resp 12/09/22 1132 18     Temp 12/09/22 1132 97.7 F (36.5 C)     Temp Source 12/09/22 1132 Oral     SpO2 12/09/22 1132 96 %     Weight --      Height --      Head Circumference --      Peak Flow --      Pain Score 12/09/22 1134 0     Pain Loc --      Pain Education --      Exclude  from Growth Chart --    No data found.  Updated Vital Signs BP 135/85 (BP Location: Right Arm)   Pulse 85   Temp 97.7 F (36.5 C) (Oral)   Resp 18   SpO2 96%   Visual Acuity Right Eye Distance:   Left Eye Distance:   Bilateral Distance:    Right Eye Near:   Left Eye Near:    Bilateral Near:     Physical Exam Physical Exam Constitutional:      General: He is not in acute distress.    Appearance: He is not toxic-appearing.  HENT:     Head: Normocephalic.     Right Ear: Tympanic membrane, ear canal and external ear normal.     Left Ear: Ear canal and external ear normal.     Nose: Nose normal.     Mouth/Throat:     Mouth: Mucous membranes are moist.     Pharynx: Oropharynx is clear.  Eyes:     General: No scleral icterus.    Conjunctiva/sclera: Conjunctivae normal.  Cardiovascular:     Rate and Rhythm: Normal rate and regular rhythm.     Heart sounds: No murmur heard.   Pulmonary:     Effort:  Pulmonary effort is normal. No respiratory distress.     Breath sounds: clear Has a wheezy sounding cough After Albuterol neb  he had mild inspiratory wheeze on LLL, the rest was clear  Musculoskeletal:        General: Normal range of motion.     Cervical back: Neck supple.  Lymphadenopathy:     Cervical: No cervical adenopathy.  Skin:    General: Skin is warm and dry.     Findings: No rash.  Neurological:     Mental Status: He is alert and oriented to person, place, and time.     Gait: Gait normal.  Psychiatric:        Mood and Affect: Mood normal.        Behavior: Behavior normal.        Thought Content: Thought content normal.        Judgment: Judgment normal.    UC Treatments / Results  Labs (all labs ordered are listed, but only abnormal results are displayed) Labs Reviewed - No data to display  EKG   Radiology DG Chest 2 View  Result Date: 12/09/2022 CLINICAL DATA:  Cough, fever. EXAM: CHEST - 2 VIEW COMPARISON:  None Available. FINDINGS: The heart size and mediastinal contours are within normal limits. Both lungs are clear. The visualized skeletal structures are unremarkable. IMPRESSION: No active cardiopulmonary disease. Electronically Signed   By: Lupita Raider M.D.   On: 12/09/2022 13:00    Procedures Procedures (including critical care time)  Medications Ordered in UC Medications  albuterol (PROVENTIL) (2.5 MG/3ML) 0.083% nebulizer solution 2.5 mg (2.5 mg Nebulization Given 12/09/22 1157)    Initial Impression / Assessment and Plan / UC Course  I have reviewed the triage vital signs and the nursing notes.  Pertinent  imaging results that were available during my care of the patient were reviewed by me and considered in my medical decision making (see chart for details).  Acute bronchitis  I placed him on Albuterol inhaler, Azithromycin and Hycodan as noted.    Final Clinical Impressions(s) / UC Diagnoses   Final diagnoses:  Acute bronchitis,  unspecified organism   Discharge Instructions   None    ED Prescriptions     Medication Sig Dispense Auth.  Provider   albuterol (VENTOLIN HFA) 108 (90 Base) MCG/ACT inhaler Inhale 2 puffs into the lungs every 4 (four) hours as needed for wheezing or shortness of breath. 6.7 g Rodriguez-Southworth, Nettie Elm, PA-C   azithromycin (ZITHROMAX) 250 MG tablet Take first 2 tablets together, then 1 every day until finished. 6 tablet Rodriguez-Southworth, Errik Mitchelle, PA-C   HYDROcodone bit-homatropine (HYCODAN) 5-1.5 MG/5ML syrup Take 5 mLs by mouth at bedtime as needed for cough. 120 mL Rodriguez-Southworth, Nettie Elm, PA-C      I have reviewed the PDMP during this encounter.   Garey Ham, PA-C 12/09/22 1311

## 2022-12-09 NOTE — ED Triage Notes (Signed)
Pt reports he has a sore throat, sinus drainage, lethargic, fever, and cough x 1 week.

## 2022-12-17 ENCOUNTER — Other Ambulatory Visit (HOSPITAL_COMMUNITY): Payer: Self-pay

## 2023-07-25 ENCOUNTER — Telehealth: Payer: Self-pay | Admitting: *Deleted

## 2023-07-25 NOTE — Telephone Encounter (Unsigned)
 Copied from CRM [id number]. Topic: General - Other >> Jul 25, 2023  2:01 PM Truddie Crumble wrote: Reason for CRM: patient wife called stating the patient got into some poison oak or ivy and need a medication for prednisone sent to [facility] in [place]

## 2023-07-26 NOTE — Telephone Encounter (Signed)
Cook, Jayce G, DO     Needs to be seen.   

## 2023-07-26 NOTE — Telephone Encounter (Signed)
 Patient states his is getting better and will call back and schedule office visit if gets worse

## 2023-08-08 DIAGNOSIS — L304 Erythema intertrigo: Secondary | ICD-10-CM | POA: Diagnosis not present

## 2023-08-08 DIAGNOSIS — L570 Actinic keratosis: Secondary | ICD-10-CM | POA: Diagnosis not present

## 2023-08-08 DIAGNOSIS — X32XXXD Exposure to sunlight, subsequent encounter: Secondary | ICD-10-CM | POA: Diagnosis not present

## 2023-08-16 ENCOUNTER — Other Ambulatory Visit (HOSPITAL_COMMUNITY): Payer: Self-pay

## 2023-08-16 MED ORDER — KETOCONAZOLE 2 % EX CREA
1.0000 | TOPICAL_CREAM | Freq: Two times a day (BID) | CUTANEOUS | 3 refills | Status: AC | PRN
  Filled 2023-08-16: qty 60, 30d supply, fill #0
  Filled 2024-02-17: qty 60, 30d supply, fill #1

## 2023-08-17 ENCOUNTER — Other Ambulatory Visit (HOSPITAL_COMMUNITY): Payer: Self-pay

## 2023-08-18 ENCOUNTER — Other Ambulatory Visit (HOSPITAL_COMMUNITY): Payer: Self-pay

## 2023-08-19 ENCOUNTER — Other Ambulatory Visit (HOSPITAL_COMMUNITY): Payer: Self-pay

## 2023-08-23 ENCOUNTER — Other Ambulatory Visit (HOSPITAL_COMMUNITY): Payer: Self-pay

## 2024-02-01 ENCOUNTER — Ambulatory Visit: Payer: Self-pay | Admitting: Surgical

## 2024-02-01 ENCOUNTER — Other Ambulatory Visit: Payer: Self-pay

## 2024-02-01 DIAGNOSIS — S42032A Displaced fracture of lateral end of left clavicle, initial encounter for closed fracture: Secondary | ICD-10-CM

## 2024-02-01 DIAGNOSIS — M25512 Pain in left shoulder: Secondary | ICD-10-CM

## 2024-02-04 ENCOUNTER — Encounter: Payer: Self-pay | Admitting: Surgical
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
## 2024-02-08 ENCOUNTER — Encounter: Payer: Self-pay | Admitting: Surgical

## 2024-02-08 ENCOUNTER — Other Ambulatory Visit: Payer: Self-pay

## 2024-02-08 ENCOUNTER — Ambulatory Visit: Admitting: Surgical

## 2024-02-08 DIAGNOSIS — S42032A Displaced fracture of lateral end of left clavicle, initial encounter for closed fracture: Secondary | ICD-10-CM

## 2024-02-08 NOTE — Progress Notes (Signed)
 Post-fracture visit Note   Patient: James Underwood           Date of Birth: April 09, 1971           MRN: [id number] Visit Date: 02/08/2024 PCP: Cook, Jayce G, DO   Assessment & Plan:  Chief Complaint:  Chief Complaint  Patient presents with   left clavicle fracture    DOI 01/23/2024   Visit Diagnoses:  1. Traumatic closed fracture of distal clavicle with minimal displacement, left, initial encounter     Plan: Patient is a 53 year old male who returns for reevaluation of left distal clavicle fracture sustained on 01/23/2024.  Doing well.  Pretty substantial improvement in the last week.  Occasionally bothers him if he moves around too much in his sleep and still bothers him with forward elevation of the left shoulder.  Has been using the sling and mostly compliant with this.  Takes ibuprofen as needed for pain control.  Slept well last night without any difficulty.  On exam, patient has minimal tenderness over the distal clavicle/AC joint.  There is no significant mobility at the fracture site.  Mild to moderate tenderness over the bicipital groove.  Intact axillary nerves with deltoid firing.  Intact supra, infra, subscap strength rated 5/5.  2+ radial pulse of the left upper extremity.  No significant deformity noted to the shoulder and there is no tenting of the skin.  Plan at this time is continue with nonoperative management.  The patient symptoms are substantially improved.  We will see him back in 2 weeks.  Plan to use the sling for 1 more week and then okay to come out of the sling for some below shoulder motion and recheck radiographs at next appointment.  At that point, he will be about a month out from injury.  AP and axial views of the left clavicle reviewed today.  Radiographs demonstrate clavicle fracture with minimal displacement compared with prior radiographs and overall mild displacement.  No evidence of compromise of coracoclavicular ligaments no other  fracture/dislocation.  Follow-Up Instructions: No follow-ups on file.   Orders:  Orders Placed This Encounter  Procedures   DG Clavicle Left   No orders of the defined types were placed in this encounter.   Imaging: No results found.  PMFS History: Patient Active Problem List   Diagnosis Date Noted   Allergy to meat 12/03/2021   Hyperlipidemia 12/03/2021   Elevated BP without diagnosis of hypertension 12/03/2021   Annual physical exam 12/03/2021   Past Medical History:  Diagnosis Date   Ankle fracture about 2007   right   Pancreatitis     Family History  Problem Relation Age of Onset   Colon cancer Neg Hx     Past Surgical History:  Procedure Laterality Date   COLONOSCOPY WITH PROPOFOL  N/A 01/20/2022   Procedure: COLONOSCOPY WITH PROPOFOL ;  Surgeon: Eartha Angelia Sieving, MD;  [facility];  Service: Gastroenterology;  Laterality: N/A;  150 ASA 1   EUS  03/22/2012   Procedure: UPPER ENDOSCOPIC ULTRASOUND (EUS) RADIAL;  Surgeon: Elsie Cree, MD;  [facility];  Service: Endoscopy;  Laterality: N/A;  radial scope   None     Social History   Occupational History   Occupation: Civil engineer, contracting: UNEMPLOYED  Tobacco Use   Smoking status: Former   Smokeless tobacco: Former  Substance and Sexual Activity   Alcohol use: No    Alcohol/week: 5.0 standard drinks of alcohol  Types: 5 Standard drinks or equivalent per week   Drug use: No   Sexual activity: Not on file

## 2024-02-12 ENCOUNTER — Telehealth: Payer: Self-pay | Admitting: Surgical

## 2024-02-12 NOTE — Telephone Encounter (Signed)
 Luke's pt - pt lvm on 02/10/24 at 10:20am stating that he was seen at [facility] office last week and that someone was supposed to call him back to advise if he needs surgery or not and no one called him back.  [phone]

## 2024-02-13 NOTE — Telephone Encounter (Signed)
I called and discussed.

## 2024-02-13 NOTE — Telephone Encounter (Signed)
 noted

## 2024-02-14 NOTE — Telephone Encounter (Signed)
 James Underwood spoke with patient by phone yesterday.

## 2024-02-17 ENCOUNTER — Other Ambulatory Visit (HOSPITAL_COMMUNITY): Payer: Self-pay

## 2024-02-22 ENCOUNTER — Encounter: Payer: Self-pay | Admitting: Surgical

## 2024-02-22 ENCOUNTER — Other Ambulatory Visit: Payer: Self-pay

## 2024-02-22 ENCOUNTER — Ambulatory Visit: Admitting: Surgical

## 2024-02-22 DIAGNOSIS — S42032A Displaced fracture of lateral end of left clavicle, initial encounter for closed fracture: Secondary | ICD-10-CM
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
## 2024-03-14 ENCOUNTER — Ambulatory Visit (INDEPENDENT_AMBULATORY_CARE_PROVIDER_SITE_OTHER): Admitting: Surgical

## 2024-03-14 ENCOUNTER — Encounter: Payer: Self-pay | Admitting: Surgical

## 2024-03-14 ENCOUNTER — Ambulatory Visit: Payer: Self-pay

## 2024-03-14 DIAGNOSIS — S42032A Displaced fracture of lateral end of left clavicle, initial encounter for closed fracture: Secondary | ICD-10-CM

## 2024-03-14 NOTE — Progress Notes (Signed)
 Post-fracture visit Note   Patient: James Underwood           Date of Birth: 11/17/1970           MRN: [id number] Visit Date: 03/14/2024 PCP: Cook, Jayce G, DO   Assessment & Plan:  Chief Complaint:  Chief Complaint  Patient presents with   left clavicle fracture follow up    DOI 01/23/2024   Visit Diagnoses:  1. Traumatic closed fracture of distal clavicle with minimal displacement, left, initial encounter     Plan: Patient is a 53 year old male who returns for reevaluation of left clavicle fracture sustained on 01/23/2024.  Doing well.  Has occasional discomfort that he describes as a dull aching sensation primarily at the end of the day and he rates this 1-2 out of 10 on the pain scale.  He has not had to take any medication for pain in over a month at this point.  He has done a little bit of riding stationary bike and below shoulder lifting but nothing excessive.  Doing some yard work and does not really limit him.  He is able to lay on his left shoulder at night without any substantial discomfort.  On exam, patient has 70 degrees X rotation, 100 degrees abduction, 170 degrees forward elevation passively and actively.  Has minimal tenderness over the distal clavicle and AC joint.  No deformity noted to the shoulder.  No crepitus noted with passive motion of the shoulder.  Axillary nerve intact with deltoid firing.  Excellent deltoid strength excellent rotator cuff strength without any reproduction of pain aside from a little bit of very mild pain with testing his external rotation strength.  AP and axial views of left clavicle radiographs taken today demonstrate new callus formation that is definitive progress compared with prior radiographs from 3 weeks ago.  Fracture line is still visible primarily on the AP view.  However with patient's substantial clinical improvement, we will plan to let him slowly increase his activities as he can tolerate and I recommended that he avoid any overhead  lifting and avoid any bike riding that causes increased discomfort or is involved with a lot of bumpiness/vibration.  He agreed with plan.  Will see him back in 6 weeks for final check and new radiographs at that time to ensure that the fracture line is less visible and there is increased callus formation and the fracture is near fully healed.  Follow-Up Instructions: Return in about 4 weeks (around 04/11/2024).   Orders:  Orders Placed This Encounter  Procedures   DG Clavicle Left   No orders of the defined types were placed in this encounter.   Imaging: No results found.  PMFS History: Patient Active Problem List   Diagnosis Date Noted   Allergy to meat 12/03/2021   Hyperlipidemia 12/03/2021   Elevated BP without diagnosis of hypertension 12/03/2021   Annual physical exam 12/03/2021   Past Medical History:  Diagnosis Date   Ankle fracture about 2007   right   Pancreatitis     Family History  Problem Relation Age of Onset   Colon cancer Neg Hx     Past Surgical History:  Procedure Laterality Date   COLONOSCOPY WITH PROPOFOL  N/A 01/20/2022   Procedure: COLONOSCOPY WITH PROPOFOL ;  Surgeon: Eartha Angelia Sieving, MD;  [facility];  Service: Gastroenterology;  Laterality: N/A;  150 ASA 1   EUS  03/22/2012   Procedure: UPPER ENDOSCOPIC ULTRASOUND (EUS) RADIAL;  Surgeon: Elsie Cree,  MD;  Location: [facility];  Service: Endoscopy;  Laterality: N/A;  radial scope   None     Social History   Occupational History   Occupation: Civil Engineer, Contracting: UNEMPLOYED  Tobacco Use   Smoking status: Former   Smokeless tobacco: Former  Substance and Sexual Activity   Alcohol use: No    Alcohol/week: 5.0 standard drinks of alcohol    Types: 5 Standard drinks or equivalent per week   Drug use: No   Sexual activity: Not on file

## 2024-03-19 ENCOUNTER — Encounter: Payer: Self-pay | Admitting: Radiology

## 2024-04-25 ENCOUNTER — Ambulatory Visit: Admitting: Surgical

## 2024-06-04 ENCOUNTER — Ambulatory Visit: Payer: Self-pay | Admitting: Family Medicine

## 2024-06-04 ENCOUNTER — Ambulatory Visit: Admitting: Family Medicine

## 2024-06-04 ENCOUNTER — Encounter: Payer: Self-pay | Admitting: Family Medicine

## 2024-06-04 ENCOUNTER — Ambulatory Visit (HOSPITAL_COMMUNITY)
Admission: RE | Admit: 2024-06-04 | Discharge: 2024-06-04 | Disposition: A | Discharge status: Home / Self Care | Source: Ambulatory Visit | Attending: Family Medicine | Admitting: Family Medicine

## 2024-06-04 VITALS — BP 126/82 | HR 72 | Temp 98.6°F | Ht 71.0 in | Wt 183.0 lb

## 2024-06-04 DIAGNOSIS — M25561 Pain in right knee: Secondary | ICD-10-CM | POA: Diagnosis not present

## 2024-06-04 MED ORDER — MELOXICAM 15 MG PO TABS: 15.0000 mg | ORAL_TABLET | Freq: Every day | ORAL | 0 refills | Status: AC | PRN

## 2024-06-04 NOTE — Assessment & Plan Note (Addendum)
 X-ray for further evaluation given exam findings and recent injury. Meloxicam  as directed. If continues to be problematic over the next 2 weeks, will pursue MRI imaging.

## 2024-06-04 NOTE — Patient Instructions (Signed)
 X-ray today.  Medication as directed.  If pain continues to persist over the next 2 weeks, please let me know and we will pursue MRI imaging.
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
## 2024-06-07 ENCOUNTER — Other Ambulatory Visit: Payer: Self-pay | Admitting: Family Medicine

## 2024-06-07 DIAGNOSIS — M25561 Pain in right knee: Secondary | ICD-10-CM

## 2024-06-13 ENCOUNTER — Ambulatory Visit (HOSPITAL_COMMUNITY)
Admission: RE | Admit: 2024-06-13 | Discharge: 2024-06-13 | Disposition: A | Discharge status: Home / Self Care | Source: Ambulatory Visit | Attending: Family Medicine | Admitting: Family Medicine

## 2024-06-13 DIAGNOSIS — M25561 Pain in right knee: Secondary | ICD-10-CM | POA: Insufficient documentation

## 2024-06-18 ENCOUNTER — Other Ambulatory Visit: Payer: Self-pay | Admitting: Family Medicine

## 2024-06-18 ENCOUNTER — Ambulatory Visit: Payer: Self-pay | Admitting: Family Medicine

## 2024-06-18 ENCOUNTER — Ambulatory Visit (HOSPITAL_COMMUNITY)

## 2024-06-18 DIAGNOSIS — M84361A Stress fracture, right tibia, initial encounter for fracture: Secondary | ICD-10-CM

## 2024-06-18 DIAGNOSIS — M25561 Pain in right knee: Secondary | ICD-10-CM

## 2024-06-25 ENCOUNTER — Ambulatory Visit: Admitting: Orthopedic Surgery
